# Patient Record
Sex: Male | Born: 1960 | Race: Black or African American | Hispanic: No | Marital: Single | Smoking: Light tobacco smoker
Health system: Southern US, Community
[De-identification: ages and names within clinical notes are randomized; demographics above are authoritative.]

## PROBLEM LIST (undated history)

## (undated) DIAGNOSIS — A150 Tuberculosis of lung: Secondary | ICD-10-CM

## (undated) DIAGNOSIS — E7800 Pure hypercholesterolemia, unspecified: Secondary | ICD-10-CM

## (undated) DIAGNOSIS — Z8249 Family history of ischemic heart disease and other diseases of the circulatory system: Secondary | ICD-10-CM

## (undated) DIAGNOSIS — E119 Type 2 diabetes mellitus without complications: Secondary | ICD-10-CM

## (undated) DIAGNOSIS — Z8739 Personal history of other diseases of the musculoskeletal system and connective tissue: Secondary | ICD-10-CM

## (undated) DIAGNOSIS — K219 Gastro-esophageal reflux disease without esophagitis: Secondary | ICD-10-CM

## (undated) DIAGNOSIS — I10 Essential (primary) hypertension: Secondary | ICD-10-CM

---

## 1979-07-22 HISTORY — PX: HEMORRHOID SURGERY: SHX153

## 1988-07-21 DIAGNOSIS — A150 Tuberculosis of lung: Secondary | ICD-10-CM

## 1988-07-21 HISTORY — DX: Tuberculosis of lung: A15.0

## 2009-02-21 ENCOUNTER — Emergency Department (HOSPITAL_COMMUNITY): Admission: EM | Admit: 2009-02-21 | Discharge: 2009-02-21 | Payer: Self-pay | Admitting: Emergency Medicine

## 2013-07-21 DIAGNOSIS — I10 Essential (primary) hypertension: Secondary | ICD-10-CM

## 2013-07-21 HISTORY — DX: Essential (primary) hypertension: I10

## 2014-06-09 ENCOUNTER — Emergency Department (HOSPITAL_COMMUNITY)
Admission: EM | Admit: 2014-06-09 | Discharge: 2014-06-09 | Disposition: A | Discharge status: Home / Self Care | Payer: Self-pay | Attending: Emergency Medicine | Admitting: Emergency Medicine

## 2014-06-09 ENCOUNTER — Encounter (HOSPITAL_COMMUNITY): Payer: Self-pay | Admitting: *Deleted

## 2014-06-09 DIAGNOSIS — R42 Dizziness and giddiness: Secondary | ICD-10-CM | POA: Insufficient documentation

## 2014-06-09 DIAGNOSIS — I10 Essential (primary) hypertension: Secondary | ICD-10-CM | POA: Insufficient documentation

## 2014-06-09 DIAGNOSIS — Z8611 Personal history of tuberculosis: Secondary | ICD-10-CM | POA: Insufficient documentation

## 2014-06-09 DIAGNOSIS — Z720 Tobacco use: Secondary | ICD-10-CM | POA: Insufficient documentation

## 2014-06-09 DIAGNOSIS — Z8669 Personal history of other diseases of the nervous system and sense organs: Secondary | ICD-10-CM | POA: Insufficient documentation

## 2014-06-09 DIAGNOSIS — R739 Hyperglycemia, unspecified: Secondary | ICD-10-CM

## 2014-06-09 DIAGNOSIS — Z86718 Personal history of other venous thrombosis and embolism: Secondary | ICD-10-CM | POA: Insufficient documentation

## 2014-06-09 DIAGNOSIS — I951 Orthostatic hypotension: Secondary | ICD-10-CM | POA: Insufficient documentation

## 2014-06-09 DIAGNOSIS — E1165 Type 2 diabetes mellitus with hyperglycemia: Secondary | ICD-10-CM | POA: Insufficient documentation

## 2014-06-09 DIAGNOSIS — Z8673 Personal history of transient ischemic attack (TIA), and cerebral infarction without residual deficits: Secondary | ICD-10-CM | POA: Insufficient documentation

## 2014-06-09 DIAGNOSIS — E860 Dehydration: Secondary | ICD-10-CM | POA: Insufficient documentation

## 2014-06-09 DIAGNOSIS — Z9114 Patient's other noncompliance with medication regimen: Secondary | ICD-10-CM | POA: Insufficient documentation

## 2014-06-09 DIAGNOSIS — I252 Old myocardial infarction: Secondary | ICD-10-CM | POA: Insufficient documentation

## 2014-06-09 HISTORY — DX: Tuberculosis of lung: A15.0

## 2014-06-09 HISTORY — DX: Essential (primary) hypertension: I10

## 2014-06-09 LAB — COMPREHENSIVE METABOLIC PANEL WITH GFR
ALT: 34 U/L (ref 0–53)
AST: 20 U/L (ref 0–37)
Albumin: 4 g/dL (ref 3.5–5.2)
Alkaline Phosphatase: 88 U/L (ref 39–117)
Anion gap: 14 (ref 5–15)
BUN: 14 mg/dL (ref 6–23)
CO2: 25 meq/L (ref 19–32)
Calcium: 9.6 mg/dL (ref 8.4–10.5)
Chloride: 99 meq/L (ref 96–112)
Creatinine, Ser: 1 mg/dL (ref 0.50–1.35)
GFR calc Af Amer: >90 mL/min
GFR calc non Af Amer: 83 mL/min — ABNORMAL LOW
Glucose, Bld: 192 mg/dL — ABNORMAL HIGH (ref 70–99)
Potassium: 4.1 meq/L (ref 3.7–5.3)
Sodium: 138 meq/L (ref 137–147)
Total Bilirubin: 0.2 mg/dL — ABNORMAL LOW (ref 0.3–1.2)
Total Protein: 8 g/dL (ref 6.0–8.3)

## 2014-06-09 LAB — CBC
HEMATOCRIT: 44 % (ref 39.0–52.0)
Hemoglobin: 14.6 g/dL (ref 13.0–17.0)
MCH: 30.5 pg (ref 26.0–34.0)
MCHC: 33.2 g/dL (ref 30.0–36.0)
MCV: 92.1 fL (ref 78.0–100.0)
Platelets: 241 10*3/uL (ref 150–400)
RBC: 4.78 MIL/uL (ref 4.22–5.81)
RDW: 13.6 % (ref 11.5–15.5)
WBC: 8.5 10*3/uL (ref 4.0–10.5)

## 2014-06-09 LAB — I-STAT TROPONIN, ED: TROPONIN I, POC: 0 ng/mL (ref 0.00–0.08)

## 2014-06-09 LAB — CBG MONITORING, ED: GLUCOSE-CAPILLARY: 219 mg/dL — AB (ref 70–99)

## 2014-06-09 MED ORDER — LISINOPRIL 10 MG PO TABS: 10.0000 mg | ORAL_TABLET | Freq: Every day | ORAL | Status: DC

## 2014-06-09 MED ORDER — METFORMIN HCL 850 MG PO TABS: 850.0000 mg | ORAL_TABLET | Freq: Two times a day (BID) | ORAL | Status: DC

## 2014-06-09 NOTE — ED Notes (Signed)
Has not taken medications (Metformin and Lisinopril). 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 Feeling lightheaded today. Denies chest pain.

## 2014-06-09 NOTE — ED Provider Notes (Signed)
CSN: [id number]     Arrival date & time 06/09/14  1310 History   First MD Initiated Contact with Patient 06/09/14 1553     Chief Complaint  Patient presents with  . Near Syncope     (Consider location/radiation/quality/duration/timing/severity/associated sxs/prior Treatment) The history is provided by the patient and medical records. No language interpreter was used.     Clemens CatholicWillie Deleon is a 53 y.o. 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 Patient reports he put eggs on the stove to boil, laid down on the couch and awoke with a start. He reports he was concerned about how long the stove had been on he rapidly jumped up running into the kitchen with an immediate feeling of lightheadedness which resolved without intervention and without syncope. Patient denies dizziness, vision changes, chest pain, shortness of breath, pain in his legs, swelling of his legs, history of DVT, history of MI, history of CVA.  Pt reports he was traveling last week, with his return flight 5 days ago, but his bag with his medications was lost on the return flight.  He reports not having his medications for the last 5 days.  He states he takes metformin and lisinopril. Patient reports discussing his symptoms with his primary care office who recommended he present to the emergency department. Associated symptoms include elevated glucoses at home. Patient reports his glucose is 175 this morning and 219 before presenting to the ER.  He reports no return of his lightheadedness or near syncope.  Pt denies fever, chills, headache, neck pain, chest pain, shortest breath, abdominal pain, nausea, vomiting, diarrhea, weakness, dysuria.  Patient reports some polyuria without polydipsia.     Past Medical History  Diagnosis Date  . Diabetes mellitus without  complication   . Hypertension   . TB (pulmonary tuberculosis)    History reviewed. No pertinent past surgical history. History reviewed. No pertinent family history. History  Substance Use Topics  . Smoking status: Current Every Day Smoker    Types: Cigarettes  . Smokeless tobacco: Not on file  . Alcohol Use: Yes     Comment: occ    Review of Systems  Constitutional: Negative for fever, diaphoresis, appetite change, fatigue and unexpected weight change.  HENT: Negative for mouth sores.   Eyes: Negative for visual disturbance.  Respiratory: Negative for cough, chest tightness, shortness of breath and wheezing.   Cardiovascular: Negative for chest pain.  Gastrointestinal: Negative for nausea, vomiting, abdominal pain, diarrhea and constipation.  Endocrine: Negative for polydipsia, polyphagia and polyuria.  Genitourinary: Negative for dysuria, urgency, frequency and hematuria.  Musculoskeletal: Negative for back pain and neck stiffness.  Skin: Negative for rash.  Allergic/Immunologic: Negative for immunocompromised state.  Neurological: Positive for light-headedness. Negative for syncope and headaches.  Hematological: Does not bruise/bleed easily.  Psychiatric/Behavioral: Negative for sleep disturbance. The patient is not nervous/anxious.       Allergies  Review of patient's allergies indicates no known allergies.  Home Medications   Prior to Admission medications   Medication Sig Start Date End Date Taking? Authorizing Provider  lisinopril (PRINIVIL,ZESTRIL) 10 MG tablet Take 1 tablet (10 mg total) by mouth daily. 06/09/14   Rheda Kassab, PA-C  metFORMIN (GLUCOPHAGE) 850 MG tablet Take 1 tablet (850 mg total) by mouth 2 (two) times daily with a meal. 06/09/14   Dahlia ClientHannah  Laynie Espy, PA-C   BP 165/94 mmHg  Pulse 70  Temp(Src) 98.6 F (37 C) (Oral)  Resp 15  Ht 6' 2.5" (1.892 m)  Wt 212 lb (96.163 kg)  BMI 26.86 kg/m2  SpO2 96% Physical Exam  Constitutional: He  is oriented to person, place, and time. He appears well-developed and well-nourished. No distress.  HENT:  Head: Normocephalic and atraumatic.  Mouth/Throat: Oropharynx is clear and moist.  Eyes: Conjunctivae and EOM are normal. Pupils are equal, round, and reactive to light. No scleral icterus.  No horizontal, vertical or rotational nystagmus  Neck: Normal range of motion. Neck supple.  Full active and passive ROM without pain No midline or paraspinal tenderness No nuchal rigidity or meningeal signs  Cardiovascular: Normal rate, regular rhythm, S1 normal, S2 normal, normal heart sounds and intact distal pulses.   No murmur heard. Pulses:      Radial pulses are 2+ on the right side, and 2+ on the left side.       Dorsalis pedis pulses are 2+ on the right side, and 2+ on the left side.       Posterior tibial pulses are 2+ on the right side, and 2+ on the left side.  No tachycardia  Pulmonary/Chest: Effort normal and breath sounds normal. No respiratory distress. He has no wheezes. He has no rales.  Abdominal: Soft. Bowel sounds are normal. He exhibits no distension. There is no tenderness. There is no rebound and no guarding.  Musculoskeletal: Normal range of motion.  No peripheral edema No pain to palpation of the calves Negative Homans sign No palpable cord  Lymphadenopathy:    He has no cervical adenopathy.  Neurological: He is alert and oriented to person, place, and time. He has normal reflexes. No cranial nerve deficit. He exhibits normal muscle tone. Coordination normal.  Mental Status:  Alert, oriented, thought content appropriate. Speech fluent without evidence of aphasia. Able to follow 2 step commands without difficulty.  Cranial Nerves:  II:  Peripheral visual fields grossly normal, pupils equal, round, reactive to light III,IV, VI: ptosis not present, extra-ocular motions intact bilaterally  V,VII: smile symmetric, left facial droop with involvement of the forehead (pt  reports this is baseline and present since he was 23); facial light touch sensation equal VIII: hearing grossly normal bilaterally  IX,X: gag reflex present  XI: bilateral shoulder shrug equal and strong XII: midline tongue extension  Motor:  5/5 in upper and lower extremities bilaterally including strong and equal grip strength and dorsiflexion/plantar flexion Sensory: Pinprick and light touch normal in all extremities.  Deep Tendon Reflexes: 2+ and symmetric  Cerebellar: normal finger-to-nose with bilateral upper extremities Gait: normal gait and balance CV: distal pulses palpable throughout  NO clonus Normal Rhomberg  Skin: Skin is warm and dry. No rash noted. He is not diaphoretic.  Psychiatric: He has a normal mood and affect. His behavior is normal. Judgment and thought content normal.  Nursing note and vitals reviewed.   ED Course  Procedures (including critical care time) Labs Review Labs Reviewed  COMPREHENSIVE METABOLIC PANEL - Abnormal; Notable for the following:    Glucose, Bld 192 (*)    Total Bilirubin 0.2 (*)    GFR calc non Af Amer 83 (*)    All other components within normal limits  CBG MONITORING, ED - Abnormal; Notable for the following:    Glucose-Capillary 219 (*)    All other components within normal limits  CBC  I-STAT TROPOININ, ED  Imaging Review No results found.   EKG Interpretation   Date/Time:  Friday June 09 2014 13:20:55 EST Ventricular Rate:  86 PR Interval:  142 QRS Duration: 92 QT Interval:  374 QTC Calculation: 447 R Axis:   4 Text Interpretation:  Normal sinus rhythm Incomplete right bundle branch  block Borderline ECG Confirmed by Fayrene FearingJAMES  MD, MARK ([id number]) on 06/09/2014  3:54:42 PM      MDM   Final diagnoses:  Lightheadedness  Hyperglycemia without ketosis  Essential hypertension  Noncompliance with medication regimen    Clemens CatholicWillie Calkin presents with c/o lightheadedness occuring this morning with a description more  consistent with orthostatic hypotension.  Pt without return of symptoms since the initial episode.  Patient is without chest pain, shortness of breath, hemoptysis, tachycardia, hypoxia or clinical symptoms of DVT to suggest that today's episode was caused by a pulmonary embolism.  No evidence of hypotension here in the emergency department. On neurologic exam, doubt CVA.  Patient's symptoms likely secondary to elevated glucose and subsequent dehydration in conjunction with rapid position change.  Labs reassuring and EKG without evidence of acute ischemia. Doubt acute coronary syndrome..  Patient ambulates here in the emergency department without difficulty. He has been given a refill of his lisinopril and metformin. He is to return immediately to the emergency department if he develops chest pain, shortness of breath or any returning or new symptoms.  I have personally reviewed patient's vitals, nursing note and any pertinent labs or imaging.  I performed an undressed physical exam.    It has been determined that no acute conditions requiring further emergency intervention are present at this time. The patient/guardian have been advised of the diagnosis and plan. I reviewed all labs and imaging including any potential incidental findings. We have discussed signs and symptoms that warrant return to the ED and they are listed in the discharge instructions.    Vital signs are stable at discharge.   BP 165/94 mmHg  Pulse 70  Temp(Src) 98.6 F (37 C) (Oral)  Resp 15  Ht 6' 2.5" (1.892 m)  Wt 212 lb (96.163 kg)  BMI 26.86 kg/m2  SpO2 96%        Dierdre ForthHannah Akisha Sturgill, PA-C 06/09/14 1746  Benny LennertJoseph L Zammit, MD 06/09/14 1909

## 2014-06-09 NOTE — ED Notes (Signed)
Pt reports recent travel and lost his bag that had bp and diabetes prescriptions in it, has been without x 5 days. Today feeling lightheaded and near syncope. No acute distress noted at triage, bp 181/101.

## 2014-06-09 NOTE — Discharge Instructions (Signed)
1. Medications: take your usual home medications, refilled for you today 2. Treatment: rest, drink plenty of fluids,  3. Follow Up: Please followup with your primary doctor in 3 days for discussion of your diagnoses and further evaluation after today's visit; if you do not have a primary care doctor use the resource guide provided to find one; Please return to the ER for return of symptoms, development of vision changes, slurred speech, CP, SOB or other concerning symptoms    Near-Syncope Near-syncope (commonly known as near fainting) is sudden weakness, dizziness, or feeling like you might pass out. During an episode of near-syncope, you may also develop pale skin, have tunnel vision, or feel sick to your stomach (nauseous). Near-syncope may occur when getting up after sitting or while standing for a long time. It is caused by a sudden decrease in blood flow to the brain. This decrease can result from various causes or triggers, most of which are not serious. However, because near-syncope can sometimes be a sign of something serious, a medical evaluation is required. The specific cause is often not determined. HOME CARE INSTRUCTIONS  Monitor your condition for any changes. The following actions may help to alleviate any discomfort you are experiencing:  Have someone stay with you until you feel stable.  Lie down right away and prop your feet up if you start feeling like you might faint. Breathe deeply and steadily. Wait until all the symptoms have passed. Most of these episodes last only a few minutes. You may feel tired for several hours.   Drink enough fluids to keep your urine clear or pale yellow.   If you are taking blood pressure or heart medicine, get up slowly when seated or lying down. Take several minutes to sit and then stand. This can reduce dizziness.  Follow up with your health care provider as directed. SEEK IMMEDIATE MEDICAL CARE IF:   You have a severe headache.   You  have unusual pain in the chest, abdomen, or back.   You are bleeding from the mouth or rectum, or you have black or tarry stool.   You have an irregular or very fast heartbeat.   You have repeated fainting or have seizure-like jerking during an episode.   You faint when sitting or lying down.   You have confusion.   You have difficulty walking.   You have severe weakness.   You have vision problems.  MAKE SURE YOU:   Understand these instructions.  Will watch your condition.  Will get help right away if you are not doing well or get worse. Document Released: 07/07/2005 Document Revised: 07/12/2013 Document Reviewed: 12/10/2012 [facility] Patient Information 2015 [employer] [place]. This information is not intended to replace advice given to you by your health care provider. Make sure you discuss any questions you have with your health care provider.

## 2014-07-27 ENCOUNTER — Telehealth: Payer: Self-pay | Admitting: Surgery

## 2014-07-27 NOTE — Telephone Encounter (Signed)
  CARE MANAGEMENT ED NOTE 07/27/2014  Patient:  Clemens CatholicHOWARD,Dawood   Account Number:  [id number]  Date Initiated:  07/27/2014  Documentation initiated by:  [facility]OGERS,Matt Delpizzo  Subjective/Objective Assessment:     Subjective/Objective Assessment Detail:   Patient called concerning establishing Primary Care and Medication refill     Action/Plan:   [facility]   Action/Plan Detail:   Anticipated DC Date:  07/27/2014     Status Recommendation to Physician:   Result of Recommendation:  Agreed    DC Planning Services  CM consult  Follow-up appt scheduled  Medication Assistance    Choice offered to / List presented to:            Status of service:  Completed, signed off  ED Comments:   ED Comments Detail:  ED CM received call from patient regarding the need to establish primary care and medication refills. Patient was seen in the ED and states that he scheduled an appt but the [facility] until the end of January. Patient has ran out of b/p and oral diabetic medication. [facility] agreeable, appt was schedule for 08/07/13 at 1000 am. EDP E. West PA-C called in refills x1 Lisinopril 10 mg daily and [facility] [address] [phone]. No further ED CM needs identified.

## 2014-08-07 ENCOUNTER — Ambulatory Visit: Payer: Self-pay | Admitting: Family Medicine

## 2014-09-08 ENCOUNTER — Encounter: Payer: Self-pay | Admitting: Family Medicine

## 2014-09-08 ENCOUNTER — Ambulatory Visit: Payer: Self-pay | Attending: Family Medicine | Admitting: Family Medicine

## 2014-09-08 VITALS — BP 139/81 | HR 75 | Temp 98.2°F | Resp 16 | Ht 75.0 in | Wt 207.0 lb

## 2014-09-08 DIAGNOSIS — I10 Essential (primary) hypertension: Secondary | ICD-10-CM | POA: Insufficient documentation

## 2014-09-08 DIAGNOSIS — Z720 Tobacco use: Secondary | ICD-10-CM

## 2014-09-08 DIAGNOSIS — E119 Type 2 diabetes mellitus without complications: Secondary | ICD-10-CM | POA: Insufficient documentation

## 2014-09-08 DIAGNOSIS — K047 Periapical abscess without sinus: Secondary | ICD-10-CM | POA: Insufficient documentation

## 2014-09-08 DIAGNOSIS — K088 Other specified disorders of teeth and supporting structures: Secondary | ICD-10-CM | POA: Insufficient documentation

## 2014-09-08 DIAGNOSIS — B351 Tinea unguium: Secondary | ICD-10-CM | POA: Insufficient documentation

## 2014-09-08 DIAGNOSIS — F17200 Nicotine dependence, unspecified, uncomplicated: Secondary | ICD-10-CM | POA: Insufficient documentation

## 2014-09-08 LAB — GLUCOSE, POCT (MANUAL RESULT ENTRY): POC GLUCOSE: 135 mg/dL — AB (ref 70–99)

## 2014-09-08 LAB — POCT GLYCOSYLATED HEMOGLOBIN (HGB A1C): HEMOGLOBIN A1C: 6.6

## 2014-09-08 MED ORDER — ACETAMINOPHEN-CODEINE #3 300-30 MG PO TABS: 1.0000 | ORAL_TABLET | Freq: Three times a day (TID) | ORAL | Status: DC | PRN

## 2014-09-08 MED ORDER — PENICILLIN V POTASSIUM 250 MG PO TABS: 250.0000 mg | ORAL_TABLET | Freq: Three times a day (TID) | ORAL | Status: DC

## 2014-09-08 MED ORDER — GLUCOSE BLOOD VI STRP: 1.0000 | ORAL_STRIP | Freq: Two times a day (BID) | Status: DC

## 2014-09-08 MED ORDER — TRUERESULT BLOOD GLUCOSE W/DEVICE KIT: 1.0000 | PACK | Freq: Every day | Status: DC

## 2014-09-08 MED ORDER — TRUEPLUS LANCETS 28G MISC: 1.0000 | Freq: Three times a day (TID) | Status: DC

## 2014-09-08 MED ORDER — LISINOPRIL 10 MG PO TABS: 10.0000 mg | ORAL_TABLET | Freq: Every day | ORAL | Status: DC

## 2014-09-08 MED ORDER — METFORMIN HCL 850 MG PO TABS: 850.0000 mg | ORAL_TABLET | Freq: Two times a day (BID) | ORAL | Status: DC

## 2014-09-08 NOTE — Assessment & Plan Note (Signed)
HTN:  Goal BP < 140/90 Refilled lisinopril

## 2014-09-08 NOTE — Patient Instructions (Signed)
Mr. Dimas AguasHoward,  Thank you for coming in today. It was a pleasure meeting you. I look forward to being your primary doctor.   1. Diabetes: Well controlled Goal A1c < 7 You are at goal  Diabetes  Check blood sugar day fasting (3 times per day) and before meals  Goal fasting 100  Goal after eating < 160 Beware of hypoglycemia (low blood sugar) which is blood sugar < 70 with or without symptoms   2. HTN:  Goal BP < 140/90 Refilled lisinopril   3. Dental abscess: Penicillin Tylenol #3 for pain   F/u for labs- fasting cholesterol  You will be called with results  F/u with me in 3 months  Dr. Armen PickupFunches

## 2014-09-08 NOTE — Assessment & Plan Note (Signed)
A: multiple toenails involved. Patient prefers topical P: OTC topicals for now lamisil is an option if patient desires

## 2014-09-08 NOTE — Assessment & Plan Note (Signed)
1. Diabetes: Well controlled Goal A1c < 7 You are at goal  Diabetes  Check blood sugar day fasting (3 times per day) and before meals  Goal fasting 100  Goal after eating < 160 Beware of hypoglycemia (low blood sugar) which is blood sugar < 70 with or without symptoms

## 2014-09-08 NOTE — Progress Notes (Signed)
   Subjective:    Patient ID: Jose Deleon, male    DOB: 1961/07/15, 54 y.o.   MRN: [id number] CC; establish care, DM2, HTN, dental abscess  HPI 54 yo M new patient:  1. CHRONIC DIABETES dx in 2015 Disease Monitoring  Blood Sugar Ranges: 100-130  Polyuria: no   Visual problems: no   Medication Compliance: yes  Medication Side Effects  Hypoglycemia: no   Preventitive Health Care  Eye Exam: due   Foot Exam: done today  Diet pattern: 3 meals per day   Exercise: minmum     2. HTN: no CP, SOB. Out of lisinopril for 3 days. Taking lisinopril for one year. No cough or swelling.   3. Dental pain: very poor dentition. Swelling in R upper face. No fever. No drainage. Many broken teeth.   Soc Hx: current smoker, cutting back 1 PPD x 30 years  Review of Systems As per HPI     Objective:   Physical Exam BP 139/81 mmHg  Pulse 75  Temp(Src) 98.2 F (36.8 C)  Resp 16  Ht 6\' 3"  (1.905 m)  Wt 207 lb (93.895 kg)  BMI 25.87 kg/m2  SpO2 95% General appearance: alert, cooperative and no distress Throat: abnormal findings: dentition: multiple carries, swollen R upper jaw Lungs: clear to auscultation bilaterally Extremities: extremities normal, atraumatic, no cyanosis or edema  Thickened and darkened toenails.  Diabetic foot exam done   CBG 132 Lab Results  Component Value Date   HGBA1C 6.6 09/08/2014       Assessment & Plan:

## 2014-09-08 NOTE — Assessment & Plan Note (Signed)
3. Dental abscess: Penicillin Tylenol #3 for pain

## 2014-09-08 NOTE — Progress Notes (Signed)
Patient here to establish care Patient feels he is getting a dental abscess on right upper tooth Patient needs refills on medications and would like information on proper nutrition for diabetes

## 2014-09-09 LAB — MICROALBUMIN / CREATININE URINE RATIO
Creatinine, Urine: 180.7 mg/dL
MICROALB/CREAT RATIO: 2.2 mg/g (ref 0.0–30.0)
Microalb, Ur: 0.4 mg/dL (ref <2.0)

## 2014-09-14 ENCOUNTER — Telehealth: Payer: Self-pay | Admitting: *Deleted

## 2014-09-14 NOTE — Telephone Encounter (Signed)
-----   Message from Lora PaulaJosalyn C Funches, MD sent at 09/10/2014  8:12 AM EST ----- Normal urine microalbumin.  Continue current treatment regimen

## 2014-09-14 NOTE — Telephone Encounter (Signed)
Left voice message with normal labs Continue with treatment as directed

## 2014-12-08 ENCOUNTER — Other Ambulatory Visit: Payer: Self-pay | Admitting: Family Medicine

## 2014-12-08 ENCOUNTER — Other Ambulatory Visit: Payer: Self-pay | Admitting: *Deleted

## 2014-12-08 DIAGNOSIS — E119 Type 2 diabetes mellitus without complications: Secondary | ICD-10-CM

## 2014-12-08 MED ORDER — LISINOPRIL 10 MG PO TABS: 10.0000 mg | ORAL_TABLET | Freq: Every day | ORAL | Status: DC

## 2014-12-08 NOTE — Telephone Encounter (Signed)
Patient called to request a med refill for Lisinopril, patient ran out of his medication yesterday, patient uses [facility] [address]. Please f/u

## 2014-12-11 ENCOUNTER — Ambulatory Visit: Payer: Self-pay | Admitting: Family Medicine

## 2015-10-05 ENCOUNTER — Telehealth: Payer: Self-pay | Admitting: Family Medicine

## 2015-10-05 NOTE — Telephone Encounter (Signed)
Pt. Called requesting to speak to nurse because he needs a refill on metFORMIN (GLUCOPHAGE) 850 MG tablet. Please f/u with pt.

## 2015-10-08 ENCOUNTER — Encounter: Payer: Self-pay | Admitting: Family Medicine

## 2015-10-08 ENCOUNTER — Other Ambulatory Visit: Payer: Self-pay | Admitting: Family Medicine

## 2015-10-08 ENCOUNTER — Ambulatory Visit: Payer: Self-pay | Attending: Family Medicine | Admitting: Family Medicine

## 2015-10-08 VITALS — BP 157/91 | HR 73 | Temp 98.0°F | Resp 15 | Ht 75.0 in | Wt 210.0 lb

## 2015-10-08 DIAGNOSIS — E785 Hyperlipidemia, unspecified: Secondary | ICD-10-CM

## 2015-10-08 DIAGNOSIS — G4700 Insomnia, unspecified: Secondary | ICD-10-CM

## 2015-10-08 DIAGNOSIS — N529 Male erectile dysfunction, unspecified: Secondary | ICD-10-CM | POA: Insufficient documentation

## 2015-10-08 DIAGNOSIS — N522 Drug-induced erectile dysfunction: Secondary | ICD-10-CM

## 2015-10-08 DIAGNOSIS — F17210 Nicotine dependence, cigarettes, uncomplicated: Secondary | ICD-10-CM | POA: Insufficient documentation

## 2015-10-08 DIAGNOSIS — I10 Essential (primary) hypertension: Secondary | ICD-10-CM

## 2015-10-08 DIAGNOSIS — Z1159 Encounter for screening for other viral diseases: Secondary | ICD-10-CM

## 2015-10-08 DIAGNOSIS — B353 Tinea pedis: Secondary | ICD-10-CM

## 2015-10-08 DIAGNOSIS — Z114 Encounter for screening for human immunodeficiency virus [HIV]: Secondary | ICD-10-CM

## 2015-10-08 DIAGNOSIS — E1169 Type 2 diabetes mellitus with other specified complication: Secondary | ICD-10-CM

## 2015-10-08 DIAGNOSIS — Z79899 Other long term (current) drug therapy: Secondary | ICD-10-CM | POA: Insufficient documentation

## 2015-10-08 DIAGNOSIS — E119 Type 2 diabetes mellitus without complications: Secondary | ICD-10-CM

## 2015-10-08 LAB — POCT GLYCOSYLATED HEMOGLOBIN (HGB A1C): HEMOGLOBIN A1C: 6.4

## 2015-10-08 LAB — GLUCOSE, POCT (MANUAL RESULT ENTRY): POC Glucose: 123 mg/dl — AB (ref 70–99)

## 2015-10-08 MED ORDER — METFORMIN HCL 850 MG PO TABS: 850.0000 mg | ORAL_TABLET | Freq: Every day | ORAL | Status: DC

## 2015-10-08 MED ORDER — LISINOPRIL 40 MG PO TABS: 40.0000 mg | ORAL_TABLET | Freq: Every day | ORAL | Status: DC

## 2015-10-08 MED ORDER — DIPHENHYDRAMINE HCL 25 MG PO TABS: 25.0000 mg | ORAL_TABLET | Freq: Four times a day (QID) | ORAL | Status: DC | PRN

## 2015-10-08 MED ORDER — TERBINAFINE HCL 1 % EX CREA: 1.0000 "application " | TOPICAL_CREAM | Freq: Two times a day (BID) | CUTANEOUS | Status: DC

## 2015-10-08 MED ORDER — METFORMIN HCL 850 MG PO TABS: 850.0000 mg | ORAL_TABLET | Freq: Two times a day (BID) | ORAL | Status: DC

## 2015-10-08 MED ORDER — SILDENAFIL CITRATE 100 MG PO TABS: 50.0000 mg | ORAL_TABLET | Freq: Every day | ORAL | Status: DC | PRN

## 2015-10-08 NOTE — Progress Notes (Signed)
Patient here to follow up on DM2 and HTN He is in need of refills on his metformin and lisinopril He reports he is trying to quit smoking and had his last cigarette on Friday No drugs and social ETOH No depression and anxiety

## 2015-10-08 NOTE — Assessment & Plan Note (Signed)
Well controlled Metformin once daily

## 2015-10-08 NOTE — Telephone Encounter (Signed)
Pt need OV. Last visit Feb 2016

## 2015-10-08 NOTE — Assessment & Plan Note (Signed)
A: elevated BP P: Increase lisinopril to 40 mg daily

## 2015-10-08 NOTE — Progress Notes (Signed)
Subjective:  Patient ID: Jose Deleon, male    DOB: [birth year]  Age: 55 y.o. MRN: [id number]  CC: Diabetes and Hypertension   HPI Leovardo Thoman presents for    1. CHRONIC HYPERTENSION  Disease Monitoring  Blood pressure range: not checking   Chest pain: no   Dyspnea: no   Claudication: no   Medication compliance: yes  Medication Side Effects  Lightheadedness: no   Urinary frequency: no   Edema: no   Impotence: no   Preventitive Healthcare:  Exercise: no    2. CHRONIC DIABETES  Disease Monitoring  Blood Sugar Ranges: not checking   Polyuria: no   Visual problems: no   Medication Compliance: yes  Medication Side Effects  Hypoglycemia: no   Preventitive Health Care  Eye Exam: due. Prefers to wait for health insurance.   Foot Exam: done today     3. Erectile dysfunction: for past 2 years. Having trouble getting and maintaining erections. Same partner for many years. Has not tried viagra.   4. Insomnia: has not worked for 1.5 years. Sleep is irregular and often interrupted. Sleeps for two hours. No depression or anxiety.    5. Due for screening colonoscopy: he is starting a new job. Prefers to wait until he gets his health insurance.   Social History  Substance Use Topics  . Smoking status: Current Every Day Smoker -- 0.25 packs/day    Types: Cigarettes  . Smokeless tobacco: Not on file  . Alcohol Use: Yes     Comment: occ   Outpatient Prescriptions Prior to Visit  Medication Sig Dispense Refill  . acetaminophen-codeine (TYLENOL #3) 300-30 MG per tablet Take 1 tablet by mouth every 8 (eight) hours as needed for moderate pain. 30 tablet 0  . Blood Glucose Monitoring Suppl (TRUERESULT BLOOD GLUCOSE) W/DEVICE KIT 1 each by Does not apply route daily. 1 each 0  . glucose blood (TRUETEST TEST) test strip 1 each by Other route 2 (two) times daily. Use as instructed 100 each 12  . lisinopril (PRINIVIL,ZESTRIL) 10 MG tablet Take 1 tablet (10 mg total) by mouth  daily. 30 tablet 11  . metFORMIN (GLUCOPHAGE) 850 MG tablet Take 1 tablet (850 mg total) by mouth 2 (two) times daily with a meal. 180 tablet 3  . penicillin v potassium (VEETID) 250 MG tablet Take 1 tablet (250 mg total) by mouth 3 (three) times daily. 30 tablet 0  . TRUEPLUS LANCETS 28G MISC 1 each by Does not apply route 3 (three) times daily. 100 each 12   No facility-administered medications prior to visit.    ROS Review of Systems  Constitutional: Negative for fever, chills, fatigue and unexpected weight change.  Eyes: Positive for visual disturbance.  Respiratory: Negative for cough and shortness of breath.   Cardiovascular: Negative for chest pain, palpitations and leg swelling.  Gastrointestinal: Negative for nausea, vomiting, abdominal pain, diarrhea, constipation and blood in stool.  Endocrine: Negative for polydipsia, polyphagia and polyuria.  Musculoskeletal: Negative for myalgias, back pain, arthralgias, gait problem and neck pain.  Skin: Negative for rash.  Allergic/Immunologic: Negative for immunocompromised state.  Hematological: Negative for adenopathy. Does not bruise/bleed easily.  Psychiatric/Behavioral: Positive for sleep disturbance. Negative for suicidal ideas and dysphoric mood. The patient is not nervous/anxious.     Objective:  BP 157/91 mmHg  Pulse 73  Temp(Src) 98 F (36.7 C)  Resp 15  Ht _0  (1.905 m)  Wt 210 lb (95.255 kg)  BMI 26.25 kg/m2  SpO2 97%  BP/Weight 10/08/2015 09/08/2014 10/09/2246  Systolic BP 250 037 048  Diastolic BP 91 81 94  Wt. (Lbs) 210 207 212  BMI 26.25 25.87 26.86   Physical Exam  Constitutional: He appears well-developed and well-nourished. No distress.  HENT:  Head: Normocephalic and atraumatic.  Mouth/Throat: Abnormal dentition. Dental caries present.  Neck: Normal range of motion. Neck supple.  Cardiovascular: Normal rate, regular rhythm, normal heart sounds and intact distal pulses.   Pulmonary/Chest: Effort  normal and breath sounds normal.  Musculoskeletal: He exhibits no edema.       Feet:  Neurological: He is alert.  Skin: Skin is warm and dry. No rash noted. No erythema.  Psychiatric: He has a normal mood and affect.    Lab Results  Component Value Date   HGBA1C 6.4 10/08/2015   CBG 123 Assessment & Plan:   Kendre was seen today for diabetes and hypertension.  Diagnoses and all orders for this visit:  Type 2 diabetes mellitus without complication, without long-term current use of insulin (HCC) -     Glucose (CBG) -     HgB A1c -     COMPLETE METABOLIC PANEL WITH GFR -     Lipid Panel  Essential hypertension -     lisinopril (PRINIVIL,ZESTRIL) 40 MG tablet; Take 1 tablet (40 mg total) by mouth daily. -     COMPLETE METABOLIC PANEL WITH GFR -     Lipid Panel  Diabetes mellitus without complication (HCC) -     Discontinue: metFORMIN (GLUCOPHAGE) 850 MG tablet; Take 1 tablet (850 mg total) by mouth 2 (two) times daily with a meal. -     lisinopril (PRINIVIL,ZESTRIL) 40 MG tablet; Take 1 tablet (40 mg total) by mouth daily. -     metFORMIN (GLUCOPHAGE) 850 MG tablet; Take 1 tablet (850 mg total) by mouth daily with breakfast.  Insomnia -     diphenhydrAMINE (BENADRYL) 25 MG tablet; Take 1-2 tablets (25-50 mg total) by mouth every 6 (six) hours as needed for sleep.  Drug-induced erectile dysfunction -     sildenafil (VIAGRA) 100 MG tablet; Take 0.5-1 tablets (50-100 mg total) by mouth daily as needed for erectile dysfunction.  Screening for HIV (human immunodeficiency virus) -     HIV antibody (with reflex)  Need for hepatitis C screening test -     Hepatitis C antibody, reflex    No orders of the defined types were placed in this encounter.    Follow-up: No Follow-up on file.   Boykin Nearing MD

## 2015-10-08 NOTE — Patient Instructions (Addendum)
Jose Deleon was seen today for diabetes and hypertension.  Diagnoses and all orders for this visit:  Type 2 diabetes mellitus without complication, without long-term current use of insulin (HCC) -     Glucose (CBG) -     HgB A1c -     COMPLETE METABOLIC PANEL WITH GFR -     Lipid Panel  Essential hypertension -     lisinopril (PRINIVIL,ZESTRIL) 40 MG tablet; Take 1 tablet (40 mg total) by mouth daily. -     COMPLETE METABOLIC PANEL WITH GFR -     Lipid Panel  Diabetes mellitus without complication (HCC) -     Discontinue: metFORMIN (GLUCOPHAGE) 850 MG tablet; Take 1 tablet (850 mg total) by mouth 2 (two) times daily with a meal. -     lisinopril (PRINIVIL,ZESTRIL) 40 MG tablet; Take 1 tablet (40 mg total) by mouth daily. -     metFORMIN (GLUCOPHAGE) 850 MG tablet; Take 1 tablet (850 mg total) by mouth daily with breakfast.  Insomnia -     diphenhydrAMINE (BENADRYL) 25 MG tablet; Take 1-2 tablets (25-50 mg total) by mouth every 6 (six) hours as needed for sleep.  Drug-induced erectile dysfunction -     sildenafil (VIAGRA) 100 MG tablet; Take 0.5-1 tablets (50-100 mg total) by mouth daily as needed for erectile dysfunction.  Screening for HIV (human immunodeficiency virus) -     HIV antibody (with reflex)  Need for hepatitis C screening test -     Hepatitis C antibody, reflex   F/u in  4-6 weeks for HTN  Dr. Armen PickupFunches

## 2015-10-09 LAB — LIPID PANEL
CHOL/HDL RATIO: 3 ratio (ref <=5.0)
CHOLESTEROL: 214 mg/dL — AB (ref 125–200)
HDL: 72 mg/dL (ref >=40)
LDL Cholesterol: 96 mg/dL (ref <130)
Triglycerides: 230 mg/dL — ABNORMAL HIGH (ref <150)
VLDL: 46 mg/dL — ABNORMAL HIGH (ref <30)

## 2015-10-09 LAB — COMPLETE METABOLIC PANEL WITH GFR
ALT: 26 U/L (ref 9–46)
AST: 19 U/L (ref 10–35)
Albumin: 4.2 g/dL (ref 3.6–5.1)
Alkaline Phosphatase: 80 U/L (ref 40–115)
BUN: 13 mg/dL (ref 7–25)
CALCIUM: 9.4 mg/dL (ref 8.6–10.3)
CHLORIDE: 102 mmol/L (ref 98–110)
CO2: 25 mmol/L (ref 20–31)
Creat: 0.98 mg/dL (ref 0.70–1.33)
GFR, EST NON AFRICAN AMERICAN: 86 mL/min (ref >=60)
GFR, Est African American: >89 mL/min (ref >=60)
Glucose, Bld: 106 mg/dL — ABNORMAL HIGH (ref 65–99)
POTASSIUM: 4.2 mmol/L (ref 3.5–5.3)
SODIUM: 138 mmol/L (ref 135–146)
Total Bilirubin: 0.3 mg/dL (ref 0.2–1.2)
Total Protein: 7.5 g/dL (ref 6.1–8.1)

## 2015-10-09 LAB — HIV ANTIBODY (ROUTINE TESTING W REFLEX): HIV 1&2 Ab, 4th Generation: NONREACTIVE

## 2015-10-09 LAB — HEPATITIS C ANTIBODY: HCV Ab: NEGATIVE

## 2015-10-09 MED ORDER — ATORVASTATIN CALCIUM 40 MG PO TABS: 40.0000 mg | ORAL_TABLET | Freq: Every day | ORAL | Status: DC

## 2015-10-09 NOTE — Addendum Note (Signed)
Addended by: Dessa PhiFUNCHES, Birtie Fellman on: 10/09/2015 08:48 AM   Modules accepted: Orders, SmartSet

## 2015-10-10 ENCOUNTER — Telehealth: Payer: Self-pay | Admitting: *Deleted

## 2015-10-10 NOTE — Telephone Encounter (Signed)
LVM to return call   [facility]

## 2015-10-10 NOTE — Telephone Encounter (Signed)
-----   Message from Dessa PhiJosalyn Funches, MD sent at 10/09/2015  8:47 AM EDT ----- screening HIV and Hep C negative Normal CMP Elevated cholesterol in diabetes, start lipitor 40 mg daily

## 2015-12-30 ENCOUNTER — Other Ambulatory Visit: Payer: Self-pay | Admitting: Family Medicine

## 2016-03-11 ENCOUNTER — Encounter (HOSPITAL_COMMUNITY): Payer: Self-pay | Admitting: Emergency Medicine

## 2016-03-11 ENCOUNTER — Emergency Department (HOSPITAL_COMMUNITY): Payer: Self-pay

## 2016-03-11 ENCOUNTER — Observation Stay (HOSPITAL_COMMUNITY): Payer: Self-pay

## 2016-03-11 ENCOUNTER — Observation Stay (HOSPITAL_COMMUNITY)
Admission: EM | Admit: 2016-03-11 | Discharge: 2016-03-13 | Disposition: A | Discharge status: Home / Self Care | Payer: Self-pay | Attending: Cardiovascular Disease | Admitting: Cardiovascular Disease

## 2016-03-11 DIAGNOSIS — F17200 Nicotine dependence, unspecified, uncomplicated: Secondary | ICD-10-CM | POA: Diagnosis present

## 2016-03-11 DIAGNOSIS — E119 Type 2 diabetes mellitus without complications: Secondary | ICD-10-CM | POA: Insufficient documentation

## 2016-03-11 DIAGNOSIS — Z8611 Personal history of tuberculosis: Secondary | ICD-10-CM | POA: Insufficient documentation

## 2016-03-11 DIAGNOSIS — Z8249 Family history of ischemic heart disease and other diseases of the circulatory system: Secondary | ICD-10-CM | POA: Insufficient documentation

## 2016-03-11 DIAGNOSIS — F17210 Nicotine dependence, cigarettes, uncomplicated: Secondary | ICD-10-CM | POA: Insufficient documentation

## 2016-03-11 DIAGNOSIS — R0789 Other chest pain: Principal | ICD-10-CM | POA: Insufficient documentation

## 2016-03-11 DIAGNOSIS — I509 Heart failure, unspecified: Secondary | ICD-10-CM | POA: Insufficient documentation

## 2016-03-11 DIAGNOSIS — R0600 Dyspnea, unspecified: Secondary | ICD-10-CM

## 2016-03-11 DIAGNOSIS — G4700 Insomnia, unspecified: Secondary | ICD-10-CM | POA: Insufficient documentation

## 2016-03-11 DIAGNOSIS — Z7982 Long term (current) use of aspirin: Secondary | ICD-10-CM | POA: Insufficient documentation

## 2016-03-11 DIAGNOSIS — Z720 Tobacco use: Secondary | ICD-10-CM

## 2016-03-11 DIAGNOSIS — Z7984 Long term (current) use of oral hypoglycemic drugs: Secondary | ICD-10-CM | POA: Insufficient documentation

## 2016-03-11 DIAGNOSIS — K219 Gastro-esophageal reflux disease without esophagitis: Secondary | ICD-10-CM | POA: Insufficient documentation

## 2016-03-11 DIAGNOSIS — R079 Chest pain, unspecified: Secondary | ICD-10-CM | POA: Diagnosis present

## 2016-03-11 DIAGNOSIS — I110 Hypertensive heart disease with heart failure: Secondary | ICD-10-CM | POA: Insufficient documentation

## 2016-03-11 DIAGNOSIS — I10 Essential (primary) hypertension: Secondary | ICD-10-CM | POA: Diagnosis present

## 2016-03-11 DIAGNOSIS — E785 Hyperlipidemia, unspecified: Secondary | ICD-10-CM | POA: Insufficient documentation

## 2016-03-11 DIAGNOSIS — Z23 Encounter for immunization: Secondary | ICD-10-CM | POA: Insufficient documentation

## 2016-03-11 HISTORY — DX: Gastro-esophageal reflux disease without esophagitis: K21.9

## 2016-03-11 HISTORY — DX: Personal history of other diseases of the musculoskeletal system and connective tissue: Z87.39

## 2016-03-11 HISTORY — DX: Family history of ischemic heart disease and other diseases of the circulatory system: Z82.49

## 2016-03-11 HISTORY — DX: Type 2 diabetes mellitus without complications: E11.9

## 2016-03-11 HISTORY — DX: Pure hypercholesterolemia, unspecified: E78.00

## 2016-03-11 LAB — I-STAT TROPONIN, ED
TROPONIN I, POC: 0 ng/mL (ref 0.00–0.08)
Troponin i, poc: 0 ng/mL (ref 0.00–0.08)

## 2016-03-11 LAB — CBC
HEMATOCRIT: 45.9 % (ref 39.0–52.0)
HEMOGLOBIN: 14.8 g/dL (ref 13.0–17.0)
MCH: 30.3 pg (ref 26.0–34.0)
MCHC: 32.2 g/dL (ref 30.0–36.0)
MCV: 93.9 fL (ref 78.0–100.0)
Platelets: 249 10*3/uL (ref 150–400)
RBC: 4.89 MIL/uL (ref 4.22–5.81)
RDW: 14 % (ref 11.5–15.5)
WBC: 9 10*3/uL (ref 4.0–10.5)

## 2016-03-11 LAB — LIPID PANEL
CHOL/HDL RATIO: 3.3 ratio
CHOLESTEROL: 203 mg/dL — AB (ref 0–200)
HDL: 62 mg/dL (ref >40)
LDL Cholesterol: 123 mg/dL — ABNORMAL HIGH (ref 0–99)
Triglycerides: 88 mg/dL (ref <150)
VLDL: 18 mg/dL (ref 0–40)

## 2016-03-11 LAB — BASIC METABOLIC PANEL
ANION GAP: 5 (ref 5–15)
BUN: 12 mg/dL (ref 6–20)
CO2: 26 mmol/L (ref 22–32)
Calcium: 9.1 mg/dL (ref 8.9–10.3)
Chloride: 105 mmol/L (ref 101–111)
Creatinine, Ser: 1.05 mg/dL (ref 0.61–1.24)
GFR calc Af Amer: >60 mL/min (ref >60)
GLUCOSE: 102 mg/dL — AB (ref 65–99)
POTASSIUM: 4.2 mmol/L (ref 3.5–5.1)
Sodium: 136 mmol/L (ref 135–145)

## 2016-03-11 LAB — SEDIMENTATION RATE: SED RATE: 10 mm/h (ref 0–16)

## 2016-03-11 LAB — D-DIMER, QUANTITATIVE (NOT AT ARMC): D DIMER QUANT: 0.74 ug{FEU}/mL — AB (ref 0.00–0.50)

## 2016-03-11 LAB — TSH: TSH: 1.752 u[IU]/mL (ref 0.350–4.500)

## 2016-03-11 MED ORDER — NICOTINE 21 MG/24HR TD PT24
21.0000 mg | MEDICATED_PATCH | Freq: Every day | TRANSDERMAL | Status: DC
  Administered 2016-03-11 – 2016-03-13 (×2): 21 mg via TRANSDERMAL
  Filled 2016-03-11 (×3): qty 1

## 2016-03-11 MED ORDER — ONDANSETRON HCL 4 MG/2ML IJ SOLN: 4.0000 mg | Freq: Four times a day (QID) | INTRAMUSCULAR | Status: DC | PRN

## 2016-03-11 MED ORDER — IOPAMIDOL (ISOVUE-370) INJECTION 76%
INTRAVENOUS | Status: AC
  Administered 2016-03-11: 100 mL
  Filled 2016-03-11: qty 100

## 2016-03-11 MED ORDER — ZOLPIDEM TARTRATE 5 MG PO TABS: 5.0000 mg | ORAL_TABLET | Freq: Every evening | ORAL | Status: DC | PRN

## 2016-03-11 MED ORDER — ENOXAPARIN SODIUM 40 MG/0.4ML ~~LOC~~ SOLN
40.0000 mg | SUBCUTANEOUS | Status: DC
  Administered 2016-03-11 – 2016-03-12 (×2): 40 mg via SUBCUTANEOUS
  Filled 2016-03-11 (×2): qty 0.4

## 2016-03-11 MED ORDER — NITROGLYCERIN 0.4 MG/SPRAY TL SOLN
1.0000 | Status: DC | PRN
  Filled 2016-03-11: qty 4.9

## 2016-03-11 MED ORDER — IOPAMIDOL (ISOVUE-370) INJECTION 76%
INTRAVENOUS | Status: AC
  Filled 2016-03-11: qty 100

## 2016-03-11 MED ORDER — GI COCKTAIL ~~LOC~~: 30.0000 mL | Freq: Four times a day (QID) | ORAL | Status: DC | PRN

## 2016-03-11 MED ORDER — ATORVASTATIN CALCIUM 40 MG PO TABS: 40.0000 mg | ORAL_TABLET | Freq: Every day | ORAL | Status: DC

## 2016-03-11 MED ORDER — IPRATROPIUM-ALBUTEROL 0.5-2.5 (3) MG/3ML IN SOLN: 3.0000 mL | Freq: Four times a day (QID) | RESPIRATORY_TRACT | Status: DC | PRN

## 2016-03-11 MED ORDER — ASPIRIN 81 MG PO CHEW
81.0000 mg | CHEWABLE_TABLET | Freq: Every day | ORAL | Status: DC
  Administered 2016-03-12 – 2016-03-13 (×2): 81 mg via ORAL
  Filled 2016-03-11 (×2): qty 1

## 2016-03-11 MED ORDER — ASPIRIN 81 MG PO TABS: 81.0000 mg | ORAL_TABLET | Freq: Every day | ORAL | Status: DC

## 2016-03-11 MED ORDER — PNEUMOCOCCAL VAC POLYVALENT 25 MCG/0.5ML IJ INJ
0.5000 mL | INJECTION | INTRAMUSCULAR | Status: DC
  Filled 2016-03-11: qty 0.5

## 2016-03-11 MED ORDER — LISINOPRIL 40 MG PO TABS
40.0000 mg | ORAL_TABLET | Freq: Every day | ORAL | Status: DC
  Administered 2016-03-12 – 2016-03-13 (×2): 40 mg via ORAL
  Filled 2016-03-11 (×3): qty 1

## 2016-03-11 MED ORDER — INSULIN ASPART 100 UNIT/ML ~~LOC~~ SOLN
0.0000 [IU] | Freq: Three times a day (TID) | SUBCUTANEOUS | Status: DC
  Administered 2016-03-13: 2 [IU] via SUBCUTANEOUS
  Administered 2016-03-13: 1 [IU] via SUBCUTANEOUS

## 2016-03-11 MED ORDER — MORPHINE SULFATE (PF) 2 MG/ML IV SOLN: 2.0000 mg | INTRAVENOUS | Status: DC | PRN

## 2016-03-11 MED ORDER — ACETAMINOPHEN 325 MG PO TABS
650.0000 mg | ORAL_TABLET | ORAL | Status: DC | PRN
  Administered 2016-03-13: 650 mg via ORAL
  Filled 2016-03-11: qty 2

## 2016-03-11 NOTE — ED Triage Notes (Signed)
Pt sts left sided CP that is intermittent in nature and woke him from sleep this am

## 2016-03-11 NOTE — H&P (Signed)
History and Physical  Jose Deleon YTK:[id number] DOB: 08/01/1960 DOA: 03/11/2016  Referring physician: Kathrynn Humble, MD  PCP: Minerva Ends, MD   Chief Complaint: Chest Pain  HPI: Jose Deleon is a 55 y.o. male  Jose Deleon is a 55 y.o. male  with a PMH of DM, HTN, and pulmonary TB in 1991 who presents to the Emergency Department complaining of left sided chest pain that woke him from his sleep at 3 am this morning. Pain is constant, however it will intensify for a minute or so where pain is described as sharp and "makes me jolt up like a reflex" then return to a dull ache. He describes the pain as 8/10 in severity. He denies shortness of breath. He reports that breathing does not affect the pain. The pain presented spontaneously. The patient denies heavy lifting.  The patient denies radiation of the pain into the left arm but does endorse pain in the left shoulder. No change with exertion or movement. No shortness of breath, headache, visual changes, n/v, diaphoresis. Patient reports nasal congestion over the last 3-4 days. He denies fever and chills.  He has a chronic smoker's cough but unchanged recently.  Risk factors: HTN, DM, + smoker. Mother died of MI at 82. No hx of heart disease.  Associated symptoms include cough. Pertinent negatives include no abdominal pain, no back pain, no diaphoresis, no fever, no headaches, no nausea, no palpitations, no shortness of breath and no vomiting.    Review of Systems:  Constitutional: Negative for chills, diaphoresis and fever.  HENT: Positive for congestion.   Eyes: Negative for visual disturbance.  Respiratory: Positive for cough. Negative for shortness of breath.   Cardiovascular: Positive for chest pain. Negative for palpitations and leg swelling.  Gastrointestinal: Negative for abdominal pain, nausea and vomiting.  Genitourinary: Negative for dysuria.  Musculoskeletal: Negative for back pain and neck pain.  Skin: Negative for rash.    Neurological: Negative for headache All other systems reviewed and are negative.  Past Medical History:  Diagnosis Date  . Diabetes mellitus without complication ([place]) 5573  . FH: heart disease   . Hypertension 2015  . TB (pulmonary tuberculosis) 1991   History reviewed. No pertinent surgical history. Social History:  reports that he has been smoking Cigarettes.  He has been smoking about 0.25 packs per day. He does not have any smokeless tobacco history on file. He reports that he drinks alcohol. He reports that he does not use drugs.  No Known Allergies  Family History  Problem Relation Age of Onset  . Hypertension Mother   . Hypertension Father   The patient reports a strong FH of heart disease.    Prior to Admission medications   Medication Sig Start Date End Date Taking? Authorizing Provider  aspirin 81 MG tablet 81 mg daily.   Yes Historical Provider, MD  Cinnamon 500 MG TABS Take by mouth.   Yes Historical Provider, MD  lisinopril (PRINIVIL,ZESTRIL) 40 MG tablet Take 1 tablet (40 mg total) by mouth daily. 10/08/15  Yes Boykin Nearing, MD  metFORMIN (GLUCOPHAGE) 850 MG tablet Take 1 tablet (850 mg total) by mouth daily with breakfast. 10/08/15  Yes Josalyn Funches, MD  sildenafil (VIAGRA) 100 MG tablet Take 0.5-1 tablets (50-100 mg total) by mouth daily as needed for erectile dysfunction. 10/08/15  Yes Josalyn Funches, MD  atorvastatin (LIPITOR) 40 MG tablet Take 1 tablet (40 mg total) by mouth daily. Patient not taking: Reported on 03/11/2016 10/09/15  Boykin Nearing, MD  Blood Glucose Monitoring Suppl (TRUERESULT BLOOD GLUCOSE) W/DEVICE KIT 1 each by Does not apply route daily. 09/08/14   Josalyn Funches, MD  diphenhydrAMINE (BENADRYL) 25 MG tablet Take 1-2 tablets (25-50 mg total) by mouth every 6 (six) hours as needed for sleep. Patient not taking: Reported on 03/11/2016 10/08/15   Boykin Nearing, MD  glucose blood (TRUETEST TEST) test strip 1 each by Other route 2 (two)  times daily. Use as instructed 09/08/14   Josalyn Funches, MD  terbinafine (LAMISIL AT) 1 % cream Apply 1 application topically 2 (two) times daily. Patient not taking: Reported on 03/11/2016 10/08/15   Boykin Nearing, MD  TRUEPLUS LANCETS 28G MISC 1 each by Does not apply route 3 (three) times daily. Patient not taking: Reported on 03/11/2016 09/08/14   Boykin Nearing, MD   Physical Exam: Vitals:   03/11/16 1236 03/11/16 1238 03/11/16 1533  BP: 141/89  151/100  Pulse: 67  (!) 58  Resp: 18  (!) 115  Temp: 98.3 F (36.8 C)    TempSrc: Oral    SpO2: 96%  100%  Weight:  93 kg (205 lb)   Height:  _0  (1.905 m)      General exam: well built and nourished patient, lying comfortably supine on the gurney in no obvious distress.  Head, eyes and ENT: Nontraumatic and normocephalic. Pupils equally reacting to light and accommodation. Oral mucosa moist.  Neck: Supple. No JVD, carotid bruit or thyromegaly.  Lymphatics: No lymphadenopathy.  Respiratory system: Clear to auscultation. No increased work of breathing.  No rash on chest or chest wall tenderness.    Cardiovascular system: S1 and S2 heard, RRR. No JVD, murmurs, gallops, clicks or pedal edema.  Gastrointestinal system: Abdomen is nondistended, soft and nontender. Normal bowel sounds heard. No organomegaly or masses appreciated.  Central nervous system: Alert and oriented. No focal neurological deficits.  Extremities: Peripheral pulses symmetrically felt. No cyanosis or pretibial edema.    Skin: No rashes or acute findings.  Musculoskeletal system: Negative exam.  Psychiatry: Pleasant and cooperative.   Labs on Admission:  Basic Metabolic Panel:  Recent Labs Lab 03/11/16 1245  NA 136  K 4.2  CL 105  CO2 26  GLUCOSE 102*  BUN 12  CREATININE 1.05  CALCIUM 9.1   Liver Function Tests: No results for input(s): AST, ALT, ALKPHOS, BILITOT, PROT, ALBUMIN in the last 168 hours. No results for input(s): LIPASE, AMYLASE  in the last 168 hours. No results for input(s): AMMONIA in the last 168 hours. CBC:  Recent Labs Lab 03/11/16 1245  WBC 9.0  HGB 14.8  HCT 45.9  MCV 93.9  PLT 249   Cardiac Enzymes: No results for input(s): CKTOTAL, CKMB, CKMBINDEX, TROPONINI in the last 168 hours.  BNP (last 3 results) No results for input(s): PROBNP in the last 8760 hours. CBG: No results for input(s): GLUCAP in the last 168 hours.  Radiological Exams on Admission: Dg Chest 2 View  Result Date: 03/11/2016 CLINICAL DATA:  Left upper chest pain since 3 a.m. History of hypertension diabetes mellitus. EXAM: CHEST  2 VIEW COMPARISON:  None. FINDINGS: The lungs are well inflated. Cardiomediastinal contours are normal. No pneumothorax or sizable pleural effusion. There is right lung volume loss with right apical airspace opacity. The left lung is clear. No pulmonary edema. IMPRESSION: Right upper lobe airspace opacities with associated volume loss. Atelectasis is favored, though pneumonia would be difficult to exclude. Electronically Signed   By: Ulyses Jarred  M.D.   On: 03/11/2016 13:39   EKG: Independently reviewed and abnormal.   Assessment/Plan Principal Problem:   Chest pain at rest Active Problems:   Diabetes mellitus without complication ([place])   Hypertension   Current smoker   Insomnia   STRONG FH: heart disease  1. Atypical chest pain with risk factors for heart disease-heart score 5. Admit for observation, continuous telemetry, cycle troponin, provide oxygen, nitroglycerin, morphine, aspirin. Given the abrupt onset of symptoms and the persistence of symptoms will get CT chest rule out pulmonary embolus. Nothing by mouth after midnight with arrangements for nuclear stress testing in the morning. Repeat serial EKG. 2. Diabetes mellitus, type II, non-insulin-requiring-check hemoglobin A1c, hold metformin while in hospital, monitor blood glucose and provide supplemental sliding scale coverage for elevated blood  glucose readings, carb modified diet. 3. Hypertension-reported as suboptimally controlled however recent blood pressures have been well. Monitor. 4. Dyslipidemia-check lipid panel, continue atorvastatin daily. 5. Chronic active nicotine dependence-counsel at bedside regarding tobacco cessation and will provide nicotine patch while in hospital. 6. Abnormal EKG and Abnormal CXR - serial EKG testing and continuous telemetry monitoring ordered, the patient does not have clinical symptoms or findings of pneumonia, ordered incentive spirometry, tobacco cessation and repeat chest exam 8/23.  I suspect that he has some atelectasis.  Follow CT chest results.    DVT Prophylaxis: enoxaparin Code Status: FULL  Family Communication: none at bedside  Disposition Plan: TBD   Time spent: 45 mins  Irwin Brakeman, MD [facility] Pager [phone]  If 7PM-7AM, please contact night-coverage [email or web address] Password [facility] 03/11/2016, 5:26 PM

## 2016-03-11 NOTE — ED Provider Notes (Signed)
[facility] DEPT Provider Note   CSN: [id number] Arrival date & time: 03/11/16  1230     History   Chief Complaint Chief Complaint  Patient presents with  . Chest Pain    HPI Jose Deleon is a 55 y.o. male.  The history is provided by the patient and medical records. No language interpreter was used.  Chest Pain   Associated symptoms include cough. Pertinent negatives include no abdominal pain, no back pain, no diaphoresis, no fever, no headaches, no nausea, no palpitations, no shortness of breath and no vomiting.   Jose Deleon is a 55 y.o. male  with a PMH of DM, HTN, and pulmonary TB in 1991 who presents to the Emergency Department complaining of left sided chest pain that woke him from his sleep at 3 am this morning. Pain is constant, however it will intensify for a minute or so where pain is described as sharp and "makes me jolt up like a reflex" then return to a dull ache. No change with exertion or movement. No shortness of breath, headache, visual changes, n/v, diaphoresis. Patient endorses nasal congestion over the last 3-4 days. Also endorses a cough, but states no change from usual daily "smokers cough" Took aspirin at onset and later in the morning with no relief. No alleviating or aggravating factors noted. No hx of similar sxs.   Risk factors: HTN, DM, + smoker. Mother died of MI at 15. No hx of heart disease.   Past Medical History:  Diagnosis Date  . Diabetes mellitus without complication ([place]) 1856  . FH: heart disease   . Hypertension 2015  . TB (pulmonary tuberculosis) 1991    Patient Active Problem List   Diagnosis Date Noted  . Chest pain at rest 03/11/2016  . Chest pain with high risk for cardiac etiology 03/11/2016  . STRONG FH: heart disease   . Tinea pedis of right foot 10/08/2015  . Insomnia 10/08/2015  . 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 Dental abscess 09/08/2014  . Current smoker 09/08/2014  . Onychomycosis 09/08/2014  .  Diabetes mellitus without complication ([place])   . Hypertension     History reviewed. No pertinent surgical history.     Home Medications    Prior to Admission medications   Medication Sig Start Date End Date Taking? Authorizing Provider  aspirin 81 MG tablet 81 mg daily.   Yes Historical Provider, MD  Cinnamon 500 MG TABS Take by mouth.   Yes Historical Provider, MD  lisinopril (PRINIVIL,ZESTRIL) 40 MG tablet Take 1 tablet (40 mg total) by mouth daily. 10/08/15  Yes Boykin Nearing, MD  metFORMIN (GLUCOPHAGE) 850 MG tablet Take 1 tablet (850 mg total) by mouth daily with breakfast. 10/08/15  Yes Josalyn Funches, MD  sildenafil (VIAGRA) 100 MG tablet Take 0.5-1 tablets (50-100 mg total) by mouth daily as needed for erectile dysfunction. 10/08/15  Yes Josalyn Funches, MD  atorvastatin (LIPITOR) 40 MG tablet Take 1 tablet (40 mg total) by mouth daily. Patient not taking: Reported on 03/11/2016 10/09/15   Boykin Nearing, MD  Blood Glucose Monitoring Suppl (TRUERESULT BLOOD GLUCOSE) W/DEVICE KIT 1 each by Does not apply route daily. 09/08/14   Josalyn Funches, MD  diphenhydrAMINE (BENADRYL) 25 MG tablet Take 1-2 tablets (25-50 mg total) by mouth every 6 (six) hours as needed for sleep. Patient not taking: Reported on 03/11/2016 10/08/15   Boykin Nearing, MD  glucose blood (TRUETEST TEST) test strip 1 each by Other route 2 (two) times daily. Use as instructed  09/08/14   Josalyn Funches, MD  terbinafine (LAMISIL AT) 1 % cream Apply 1 application topically 2 (two) times daily. Patient not taking: Reported on 03/11/2016 10/08/15   Boykin Nearing, MD  TRUEPLUS LANCETS 28G MISC 1 each by Does not apply route 3 (three) times daily. Patient not taking: Reported on 03/11/2016 09/08/14   Boykin Nearing, MD    Family History Family History  Problem Relation Age of Onset  . Hypertension Mother   . Hypertension Father     Social History Social History  Substance Use Topics  . Smoking status: Light  Tobacco Smoker    Packs/day: 0.25    Types: Cigarettes  . Smokeless tobacco: Not on file  . Alcohol use Yes     Comment: occ     Allergies   Review of patient's allergies indicates no known allergies.   Review of Systems Review of Systems  Constitutional: Negative for chills, diaphoresis and fever.  HENT: Positive for congestion.   Eyes: Negative for visual disturbance.  Respiratory: Positive for cough. Negative for shortness of breath.   Cardiovascular: Positive for chest pain. Negative for palpitations and leg swelling.  Gastrointestinal: Negative for abdominal pain, nausea and vomiting.  Genitourinary: Negative for dysuria.  Musculoskeletal: Negative for back pain and neck pain.  Skin: Negative for rash.  Neurological: Negative for headaches.     Physical Exam Updated Vital Signs BP (!) 152/91 (BP Location: Right Arm)   Pulse 61   Temp 98.7 F (37.1 C) (Oral)   Resp 18   Ht 6' 3"  (1.905 m)   Wt 91.4 kg   SpO2 99%   BMI 25.19 kg/m   Physical Exam  Constitutional: He is oriented to person, place, and time.  WDWN pleasant male speaking in full sentences and in NAD.   HENT:  Head: Normocephalic and atraumatic.  Cardiovascular: Normal rate, regular rhythm, normal heart sounds and intact distal pulses.  Exam reveals no gallop and no friction rub.   No murmur heard. Pulmonary/Chest: Effort normal and breath sounds normal. No respiratory distress. He has no wheezes. He has no rales. He exhibits no tenderness.  Abdominal: Soft. He exhibits no distension. There is no tenderness.  Musculoskeletal: He exhibits no edema.  No tenderness to palpation of chest wall or left shoulder. Shoulder with full ROM without pain.   Neurological: He is alert and oriented to person, place, and time.  Skin: Skin is warm and dry. He is not diaphoretic.  Nursing note and vitals reviewed.    ED Treatments / Results  Labs (all labs ordered are listed, but only abnormal results are  displayed) Labs Reviewed  BASIC METABOLIC PANEL - Abnormal; Notable for the following:       Result Value   Glucose, Bld 102 (*)    All other components within normal limits  D-DIMER, QUANTITATIVE (NOT AT [facility]) - Abnormal; Notable for the following:    D-Dimer, Quant 0.74 (*)    All other components within normal limits  CBC  LIPID PANEL  HEMOGLOBIN A1C  TSH  SEDIMENTATION RATE  VITAMIN D 25 HYDROXY (VIT D DEFICIENCY, FRACTURES)  I-STAT TROPOININ, ED  I-STAT TROPOININ, ED    EKG  EKG Interpretation  Date/Time:  Tuesday March 11 2016 12:37:47 EDT Ventricular Rate:  65 PR Interval:  154 QRS Duration: 92 QT Interval:  426 QTC Calculation: 443 R Axis:   28 Text Interpretation:  Normal sinus rhythm Incomplete right bundle branch block Borderline ECG Nonspecific ST elevation in apical  leads Reconfirmed by Kathrynn Humble, MD, Thelma Comp [phone]) on 03/11/2016 4:53:24 PM       Radiology Dg Chest 2 View  Result Date: 03/11/2016 CLINICAL DATA:  Left upper chest pain since 3 a.m. History of hypertension diabetes mellitus. EXAM: CHEST  2 VIEW COMPARISON:  None. FINDINGS: The lungs are well inflated. Cardiomediastinal contours are normal. No pneumothorax or sizable pleural effusion. There is right lung volume loss with right apical airspace opacity. The left lung is clear. No pulmonary edema. IMPRESSION: Right upper lobe airspace opacities with associated volume loss. Atelectasis is favored, though pneumonia would be difficult to exclude. Electronically Signed   By: Ulyses Jarred M.D.   On: 03/11/2016 13:39   Ct Angio Chest Aorta W/cm &/or Wo/cm  Result Date: 03/11/2016 CLINICAL DATA:  Left-sided chest pain history of TB in 1991. EXAM: CT ANGIOGRAPHY CHEST WITH CONTRAST TECHNIQUE: Multidetector CT imaging of the chest was performed using the standard protocol during bolus administration of intravenous contrast. Multiplanar CT image reconstructions and MIPs were obtained to evaluate the vascular  anatomy. CONTRAST:  100 cc Isovue 370 intravenously. COMPARISON:  Chest radiograph 03/11/2016 FINDINGS: Mediastinum/Lymph Nodes: No pulmonary emboli or thoracic aortic dissection identified. No masses or pathologically enlarged lymph nodes identified. The heart is normal in size. There is no pericardial effusion. Lungs/Pleura: There are upper lobe predominant chronic interstitial lung changes with traction bronchiectasis and thick-walled bulla formation, heavily affecting the right upper lobe. No evidence of acute airspace consolidation. There is biapical subpleural thickening which may also represent scarring. Upper abdomen: No acute findings. Musculoskeletal: No chest wall mass or suspicious bone lesions identified. Review of the MIP images confirms the above findings. IMPRESSION: Upper lobe predominant chronic interstitial lung changes, including thickening and architectural distortion of the interstitial markings, bronchial thickening and bronchiectasis and thick walled bulla formation. These changes may be sequela of prior granulomatous disease, which is supported by patient has history of prior TB. Bronchoalveolar carcinoma is less favored, however is on the differential diagnosis, particularly in the medial aspect of the right upper lobe. No evidence of pulmonary embolus. Electronically Signed   By: Fidela Salisbury M.D.   On: 03/11/2016 19:24    Procedures Procedures (including critical care time)  Medications Ordered in ED Medications  lisinopril (PRINIVIL,ZESTRIL) tablet 40 mg (not administered)  acetaminophen (TYLENOL) tablet 650 mg (not administered)  ondansetron (ZOFRAN) injection 4 mg (not administered)  enoxaparin (LOVENOX) injection 40 mg (40 mg Subcutaneous Given 03/11/16 2014)  morphine 2 MG/ML injection 2 mg (not administered)  gi cocktail (Maalox,Lidocaine,Donnatal) (not administered)  zolpidem (AMBIEN) tablet 5 mg (not administered)  insulin aspart (novoLOG) injection 0-9 Units  (not administered)  nicotine (NICODERM CQ - dosed in mg/24 hours) patch 21 mg (21 mg Transdermal Patch Applied 03/11/16 2014)  nitroGLYCERIN (NITROLINGUAL) 0.4 MG/SPRAY spray 1 spray (not administered)  ipratropium-albuterol (DUONEB) 0.5-2.5 (3) MG/3ML nebulizer solution 3 mL (not administered)  iopamidol (ISOVUE-370) 76 % injection (not administered)  aspirin chewable tablet 81 mg (not administered)  iopamidol (ISOVUE-370) 76 % injection (100 mLs  Contrast Given 03/11/16 1845)     Initial Impression / Assessment and Plan / ED Course  I have reviewed the triage vital signs and the nursing notes.  Pertinent labs & imaging results that were available during my care of the patient were reviewed by me and considered in my medical decision making (see chart for details).  Clinical Course   Tyion Boylen is a 55 y.o. male who presents to ED for constant left-sided chest  pain and multiple risk factors for ACS. Heart score of 5. Labs reviewed and reassuring. Trop negative x 2. CXR reviewed - clinically patient not exhibiting signs of PNA and lungs clear. Infectious etiology unlikely. EKG reviewed with nonspecific findings. Given risk factors and no clear source of left sided chest pain, will admit for obs chest pain rule out.   Patient discussed with Dr. Kathrynn Humble who agrees with treatment plan.   Final Clinical Impressions(s) / ED Diagnoses   Final diagnoses:  FH: heart disease  Chest pain  Acute dyspnea    New Prescriptions Current Discharge Medication List        [facility] [place], PA-C 03/11/16 2022    Varney Biles, MD 03/12/16 1517

## 2016-03-11 NOTE — ED Notes (Signed)
PA at bedside.

## 2016-03-12 ENCOUNTER — Observation Stay (HOSPITAL_BASED_OUTPATIENT_CLINIC_OR_DEPARTMENT_OTHER): Payer: Self-pay

## 2016-03-12 ENCOUNTER — Encounter (HOSPITAL_COMMUNITY): Payer: Self-pay | Admitting: Physician Assistant

## 2016-03-12 ENCOUNTER — Observation Stay (HOSPITAL_COMMUNITY): Payer: Self-pay

## 2016-03-12 DIAGNOSIS — I509 Heart failure, unspecified: Secondary | ICD-10-CM

## 2016-03-12 DIAGNOSIS — R079 Chest pain, unspecified: Secondary | ICD-10-CM

## 2016-03-12 LAB — NM MYOCAR MULTI W/SPECT W/WALL MOTION / EF: CHL CUP RESTING HR STRESS: 59 {beats}/min

## 2016-03-12 LAB — ECHOCARDIOGRAM COMPLETE
HEIGHTINCHES: 75 in
WEIGHTICAEL: 3214.4 [oz_av]

## 2016-03-12 LAB — GLUCOSE, CAPILLARY
GLUCOSE-CAPILLARY: 148 mg/dL — AB (ref 65–99)
Glucose-Capillary: 106 mg/dL — ABNORMAL HIGH (ref 65–99)
Glucose-Capillary: 122 mg/dL — ABNORMAL HIGH (ref 65–99)
Glucose-Capillary: 109 mg/dL — ABNORMAL HIGH (ref 65–99)

## 2016-03-12 MED ORDER — ASPIRIN 81 MG PO CHEW
81.0000 mg | CHEWABLE_TABLET | ORAL | Status: AC
  Administered 2016-03-13: 81 mg via ORAL
  Filled 2016-03-12: qty 1

## 2016-03-12 MED ORDER — SODIUM CHLORIDE 0.9% FLUSH: 3.0000 mL | INTRAVENOUS | Status: DC | PRN

## 2016-03-12 MED ORDER — REGADENOSON 0.4 MG/5ML IV SOLN
INTRAVENOUS | Status: AC
  Administered 2016-03-12: 0.4 mg via INTRAVENOUS
  Filled 2016-03-12: qty 5

## 2016-03-12 MED ORDER — TECHNETIUM TC 99M TETROFOSMIN IV KIT
10.0000 | PACK | Freq: Once | INTRAVENOUS | Status: AC | PRN
  Administered 2016-03-12: 10 via INTRAVENOUS

## 2016-03-12 MED ORDER — TECHNETIUM TC 99M TETROFOSMIN IV KIT
30.0000 | PACK | Freq: Once | INTRAVENOUS | Status: AC | PRN
  Administered 2016-03-12: 30 via INTRAVENOUS

## 2016-03-12 MED ORDER — SODIUM CHLORIDE 0.9% FLUSH
3.0000 mL | Freq: Two times a day (BID) | INTRAVENOUS | Status: DC
  Administered 2016-03-12: 3 mL via INTRAVENOUS

## 2016-03-12 MED ORDER — SODIUM CHLORIDE 0.9 % WEIGHT BASED INFUSION
3.0000 mL/kg/h | INTRAVENOUS | Status: DC
  Administered 2016-03-13: 3 mL/kg/h via INTRAVENOUS

## 2016-03-12 MED ORDER — SODIUM CHLORIDE 0.9 % IV SOLN: 250.0000 mL | INTRAVENOUS | Status: DC | PRN

## 2016-03-12 MED ORDER — REGADENOSON 0.4 MG/5ML IV SOLN
0.4000 mg | Freq: Once | INTRAVENOUS | Status: AC
Start: 2016-03-12 — End: 2016-03-12
  Administered 2016-03-12: 0.4 mg via INTRAVENOUS
  Filled 2016-03-12: qty 5

## 2016-03-12 MED ORDER — SODIUM CHLORIDE 0.9 % WEIGHT BASED INFUSION: 1.0000 mL/kg/h | INTRAVENOUS | Status: DC

## 2016-03-12 NOTE — Progress Notes (Signed)
  Echocardiogram 2D Echocardiogram has been performed.  Janalyn HarderWest, Issabela Lesko R 03/12/2016, 3:58 PM

## 2016-03-12 NOTE — Progress Notes (Addendum)
PROGRESS NOTE  Jose Deleon ZOX:[id number] DOB: Jun 24, 1961 DOA: 03/11/2016 PCP: Lora Paula, MD  HPI/Recap of past 24 hours:  currently chest pain free  Assessment/Plan: Principal Problem:   Chest pain at rest Active Problems:   Diabetes mellitus without complication (HCC)   Hypertension   Current smoker   Insomnia   STRONG FH: heart disease   Chest pain with high risk for cardiac etiology   1. Atypical chest pain with risk factors for heart disease-heart score 5. Telemetry with sinus bradycardia with occasional 2s sinus pause,  Troponin negative x3, nitroglycerin, morphine, aspirin. Given the abrupt onset of symptoms and the persistence of symptoms CTA chest was ordered which was negative for PE.rule out pulmonary embolus.low risk  nuclear stress  On 8/23, however, echo with concerning features for which cardiology recommended cardiac cath on 8/24. 2. Diabetes mellitus, type II, non-insulin-requiring-check hemoglobin A1c, hold metformin while in hospital, monitor blood glucose and provide supplemental sliding scale coverage for elevated blood glucose readings, carb modified diet. 3. Hypertension-reported as suboptimally controlled however recent blood pressures have been well. Monitor. 4. Dyslipidemia-check lipid panel, continue atorvastatin daily. 5. Chronic active nicotine dependence-counsel at bedside regarding tobacco cessation and will provide nicotine patch while in hospital. 6. 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   DVT Prophylaxis: enoxaparin Code Status: FULL  Family Communication: none at bedside  Disposition Plan: pending     Consultants:  cardiology  Procedures:  Cardiac stress test on 8/23  Cardiac cath on 8/24  Antibiotics:  none   Objective: BP (!) 150/82 (BP Location: Left Arm)   Pulse 62   Temp 98.5 F (36.9 C) (Oral)   Resp 18   Ht 6\' 3"  (1.905 m)   Wt 91.1 kg (200 lb 14.4  oz)   SpO2 95%   BMI 25.11 kg/m   Intake/Output Summary (Last 24 hours) at 03/12/16 1824 Last data filed at 03/12/16 1045  Gross per 24 hour  Intake              240 ml  Output                0 ml  Net              240 ml   Filed Weights   03/11/16 1238 03/11/16 1911 03/12/16 0500  Weight: 93 kg (205 lb) 91.4 kg (201 lb 8 oz) 91.1 kg (200 lb 14.4 oz)    Exam:   General:  NAD  Cardiovascular: RRR  Respiratory: CTABL  Abdomen: Soft/ND/NT, positive BS  Musculoskeletal: No Edema  Neuro: aaox3  Data Reviewed: Basic Metabolic Panel:  Recent Labs Lab 03/11/16 1245  NA 136  K 4.2  CL 105  CO2 26  GLUCOSE 102*  BUN 12  CREATININE 1.05  CALCIUM 9.1   Liver Function Tests: No results for input(s): AST, ALT, ALKPHOS, BILITOT, PROT, ALBUMIN in the last 168 hours. No results for input(s): LIPASE, AMYLASE in the last 168 hours. No results for input(s): AMMONIA in the last 168 hours. CBC:  Recent Labs Lab 03/11/16 1245  WBC 9.0  HGB 14.8  HCT 45.9  MCV 93.9  PLT 249   Cardiac Enzymes:   No results for input(s): CKTOTAL, CKMB, CKMBINDEX, TROPONINI in the last 168 hours. BNP (last 3 results) No results for input(s): BNP in the last 8760 hours.  ProBNP (last 3 results) No results for input(s): PROBNP in the  last 8760 hours.  CBG:  Recent Labs Lab 03/12/16 0741 03/12/16 1121 03/12/16 1610  GLUCAP 106* 148* 122*    No results found for this or any previous visit (from the past 240 hour(s)).   Studies: Nm Myocar Multi W/spect W/wall Motion / Ef  Result Date: 03/12/2016 CLINICAL DATA:  Chest pain EXAM: MYOCARDIAL IMAGING WITH SPECT (REST AND PHARMACOLOGIC-STRESS) GATED LEFT VENTRICULAR WALL MOTION STUDY LEFT VENTRICULAR EJECTION FRACTION TECHNIQUE: Standard myocardial SPECT imaging was performed after resting intravenous injection of 10 mCi Tc-7292m tetrofosmin. Subsequently, intravenous infusion of Lexiscan was performed under the supervision of the  Cardiology staff. At peak effect of the drug, 30 mCi Tc-6592m tetrofosmin was injected intravenously and standard myocardial SPECT imaging was performed. Quantitative gated imaging was also performed to evaluate left ventricular wall motion, and estimate left ventricular ejection fraction. COMPARISON:  None. FINDINGS: Perfusion: There is no evidence of stress-induced ischemia. There is a moderate-sized fixed perfusion defect at the lateral apex. There is also a large fixed perfusion defect involving much of the inferior wall. Wall Motion: There is global hypokinesis. Left Ventricular Ejection Fraction: 45 % End diastolic volume 124 ml End systolic volume 68 ml IMPRESSION: 1. No stress-induced ischemia. Fixed defects in the inferior wall and apex are noted. 2. Global hypokinesis. 3. Left ventricular ejection fraction 45% 4. Non invasive risk stratification*: Intermediate risk *2012 Appropriate Use Criteria for Coronary Revascularization Focused Update: J Am Coll Cardiol. 2012;59(9):857-881. http://content.dementiazones.comonlinejacc.org/article.aspx?articleid=1201161 Electronically Signed   By: Jolaine ClickArthur  Hoss M.D.   On: 03/12/2016 12:30   Ct Angio Chest Aorta W/cm &/or Wo/cm  Result Date: 03/11/2016 CLINICAL DATA:  Left-sided chest pain history of TB in 1991. EXAM: CT ANGIOGRAPHY CHEST WITH CONTRAST TECHNIQUE: Multidetector CT imaging of the chest was performed using the standard protocol during bolus administration of intravenous contrast. Multiplanar CT image reconstructions and MIPs were obtained to evaluate the vascular anatomy. CONTRAST:  100 cc Isovue 370 intravenously. COMPARISON:  Chest radiograph 03/11/2016 FINDINGS: Mediastinum/Lymph Nodes: No pulmonary emboli or thoracic aortic dissection identified. No masses or pathologically enlarged lymph nodes identified. The heart is normal in size. There is no pericardial effusion. Lungs/Pleura: There are upper lobe predominant chronic interstitial lung changes with traction  bronchiectasis and thick-walled bulla formation, heavily affecting the right upper lobe. No evidence of acute airspace consolidation. There is biapical subpleural thickening which may also represent scarring. Upper abdomen: No acute findings. Musculoskeletal: No chest wall mass or suspicious bone lesions identified. Review of the MIP images confirms the above findings. IMPRESSION: Upper lobe predominant chronic interstitial lung changes, including thickening and architectural distortion of the interstitial markings, bronchial thickening and bronchiectasis and thick walled bulla formation. These changes may be sequela of prior granulomatous disease, which is supported by patient has history of prior TB. Bronchoalveolar carcinoma is less favored, however is on the differential diagnosis, particularly in the medial aspect of the right upper lobe. No evidence of pulmonary embolus. Electronically Signed   By: Ted Mcalpineobrinka  Dimitrova M.D.   On: 03/11/2016 19:24    Scheduled Meds: . aspirin  81 mg Oral Daily  . enoxaparin (LOVENOX) injection  40 mg Subcutaneous Q24H  . insulin aspart  0-9 Units Subcutaneous TID WC  . lisinopril  40 mg Oral Daily  . nicotine  21 mg Transdermal Daily  . pneumococcal 23 valent vaccine  0.5 mL Intramuscular Tomorrow-1000    Continuous Infusions:    Time spent: 25mins  Burlin Mcnair MD, PhD  [facility] Pager [phone]. If 7PM-7AM, please contact night-coverage  at [email or web address], password [email or web address] 03/12/2016, 6:24 PM  LOS: 0 days

## 2016-03-12 NOTE — Consult Note (Signed)
CARDIOLOGY CONSULT NOTE   Patient ID: Jose Deleon MRN: [id number] DOB/AGE: 1960/12/30 55 y.o.  Admit date: 03/11/2016  Primary Physician   Minerva Ends, MD Primary Cardiologist   New Reason for Consultation   Chest pain Requesting MD: Dr Erlinda Hong  Jose Deleon is a 55 y.o. year old male with a history of DM, GERD, HLD, HTN, TB. No previous cardiac evaluation.  He was admitted 08/22 with chest pain, cards asked to see.   He woke with chest pain at about 3 am yesterday. It was a spasm type of pain, brief, but it kept coming every 3-4 minutes. He took ASA, no help. He ate breakfast a few hours later, took usual am meds, no help. No SOB or pain with inspiration. No N&V or diaphoresis. 8/10. Averaging 10-12 x hour all day. Finally came to the ER last pm. The pain has gradually eased off, coming less frequently and not as severe.  He has never had it before.   He is generally moderately active, can walk 8 blocks without stopping, cut the grass and do other things around the house. He will occasionally get SOB with these things but has never had chest pain.   He had TB in 1990, from a co-worker. He took multi-drug therapy for about a year and a half.     Past Medical History:  Diagnosis Date  . FH: heart disease   . GERD (gastroesophageal reflux disease)   . High cholesterol   . History of gout    "reaction from the TB RX"  . Hypertension 2015  . TB (pulmonary tuberculosis) 1990  . Type II diabetes mellitus ([place]) dx ~ 2015     Past Surgical History:  Procedure Laterality Date  . Chacra   "lanced"    No Known Allergies  I have reviewed the patient's current medications . aspirin  81 mg Oral Daily  . enoxaparin (LOVENOX) injection  40 mg Subcutaneous Q24H  . insulin aspart  0-9 Units Subcutaneous TID WC  . lisinopril  40 mg Oral Daily  . nicotine  21 mg Transdermal Daily  . pneumococcal 23 valent vaccine  0.5 mL Intramuscular Tomorrow-1000      acetaminophen, gi cocktail, ipratropium-albuterol, morphine injection, nitroGLYCERIN, ondansetron (ZOFRAN) IV, zolpidem  Medication Sig  aspirin 81 MG tablet 81 mg daily.  Cinnamon 500 MG TABS Take by mouth.  lisinopril (PRINIVIL,ZESTRIL) 40 MG tablet Take 1 tablet (40 mg total) by mouth daily.  metFORMIN (GLUCOPHAGE) 850 MG tablet Take 1 tablet (850 mg total) by mouth daily with breakfast.  sildenafil (VIAGRA) 100 MG tablet Take 0.5-1 tablets (50-100 mg total) by mouth daily as needed for erectile dysfunction.  atorvastatin (LIPITOR) 40 MG tablet Take 1 tablet (40 mg total) by mouth daily. Patient not taking: Reported on 03/11/2016  Blood Glucose Monitoring Suppl (TRUERESULT BLOOD GLUCOSE) W/DEVICE KIT 1 each by Does not apply route daily.  diphenhydrAMINE (BENADRYL) 25 MG tablet Take 1-2 tablets (25-50 mg total) by mouth every 6 (six) hours as needed for sleep. Patient not taking: Reported on 03/11/2016  glucose blood (TRUETEST TEST) test strip 1 each by Other route 2 (two) times daily. Use as instructed  terbinafine (LAMISIL AT) 1 % cream Apply 1 application topically 2 (two) times daily. Patient not taking: Reported on 03/11/2016  TRUEPLUS LANCETS 28G MISC 1 each by Does not apply route 3 (three) times daily. Patient not taking: Reported on 03/11/2016     Social  History   Social History  . Marital status: Single    Spouse name: N/A  . Number of children: N/A  . Years of education: N/A   Occupational History  . Retired Tax inspector    Social History Main Topics  . Smoking status: Former Smoker    Packs/day: 0.33    Years: 30.00    Types: Cigarettes    Quit date: 03/07/2016  . Smokeless tobacco: Never Used  . Alcohol use 2.4 oz/week    4 Cans of beer per week  . Drug use: No  . Sexual activity: Not Currently   Other Topics Concern  . Not on file   Social History Narrative  . No narrative on file    Family Status  Relation Status  . Mother Deceased  .  Father Deceased  . Maternal Grandmother    Family History  Problem Relation Age of Onset  . Hypertension Mother   . Heart attack Mother 41  . Hypertension Father 18  . Multiple sclerosis Father   . Stroke Maternal Grandmother      ROS:  Full 14 point review of systems complete and found to be negative unless listed above.  Physical Exam: Blood pressure (!) 150/81, pulse (!) 58, temperature 98.1 F (36.7 C), temperature source Oral, resp. rate 18, height 6' 3"  (1.905 m), weight 200 lb 14.4 oz (91.1 kg), SpO2 98 %.  General: Well developed, well nourished, male in no acute distress Head: Eyes PERRLA, No xanthomas.   Normocephalic and atraumatic, oropharynx without edema or exudate. Dentition: good Lungs: anterior rales, minimal exp wheeze Heart: HRRR S1 S2, no rub/gallop, no murmur. pulses are 2+ all 4 extrem.   Neck: No carotid bruits. No lymphadenopathy.  JVD not elevated Abdomen: Bowel sounds present, abdomen soft and non-tender without masses or hernias noted. Msk:  No spine or cva tenderness. No weakness, no joint deformities or effusions. Extremities: No clubbing or cyanosis. No edema.  Neuro: Alert and oriented X 3. No focal deficits noted. Psych:  Good affect, responds appropriately Skin: No rashes or lesions noted.  Labs:   Lab Results  Component Value Date   WBC 9.0 03/11/2016   HGB 14.8 03/11/2016   HCT 45.9 03/11/2016   MCV 93.9 03/11/2016   PLT 249 03/11/2016     Recent Labs Lab 03/11/16 1245  NA 136  K 4.2  CL 105  CO2 26  BUN 12  CREATININE 1.05  CALCIUM 9.1  GLUCOSE 102*    Recent Labs  03/11/16 1251 03/11/16 1636  TROPIPOC 0.00 0.00   Lab Results  Component Value Date   CHOL 203 (H) 03/11/2016   HDL 62 03/11/2016   LDLCALC 123 (H) 03/11/2016   TRIG 88 03/11/2016   Lab Results  Component Value Date   DDIMER 0.74 (H) 03/11/2016   TSH  Date/Time Value Ref Range Status  03/11/2016 07:54 PM 1.752 0.350 - 4.500 uIU/mL Final   ECG:   08/22 SR, IVCD (? Inc RBBB), no acute changes  Radiology:  Dg Chest 2 View Result Date: 03/11/2016 CLINICAL DATA:  Left upper chest pain since 3 a.m. History of hypertension diabetes mellitus. EXAM: CHEST  2 VIEW COMPARISON:  None. FINDINGS: The lungs are well inflated. Cardiomediastinal contours are normal. No pneumothorax or sizable pleural effusion. There is right lung volume loss with right apical airspace opacity. The left lung is clear. No pulmonary edema. IMPRESSION: Right upper lobe airspace opacities with associated volume loss. Atelectasis is favored, though pneumonia  would be difficult to exclude. Electronically Signed   By: Ulyses Jarred M.D.   On: 03/11/2016 13:39   Ct Angio Chest Aorta W/cm &/or Wo/cm Result Date: 03/11/2016 CLINICAL DATA:  Left-sided chest pain history of TB in 1991. EXAM: CT ANGIOGRAPHY CHEST WITH CONTRAST TECHNIQUE: Multidetector CT imaging of the chest was performed using the standard protocol during bolus administration of intravenous contrast. Multiplanar CT image reconstructions and MIPs were obtained to evaluate the vascular anatomy. CONTRAST:  100 cc Isovue 370 intravenously. COMPARISON:  Chest radiograph 03/11/2016 FINDINGS: Mediastinum/Lymph Nodes: No pulmonary emboli or thoracic aortic dissection identified. No masses or pathologically enlarged lymph nodes identified. The heart is normal in size. There is no pericardial effusion. Lungs/Pleura: There are upper lobe predominant chronic interstitial lung changes with traction bronchiectasis and thick-walled bulla formation, heavily affecting the right upper lobe. No evidence of acute airspace consolidation. There is biapical subpleural thickening which may also represent scarring. Upper abdomen: No acute findings. Musculoskeletal: No chest wall mass or suspicious bone lesions identified. Review of the MIP images confirms the above findings. IMPRESSION: Upper lobe predominant chronic interstitial lung changes, including  thickening and architectural distortion of the interstitial markings, bronchial thickening and bronchiectasis and thick walled bulla formation. These changes may be sequela of prior granulomatous disease, which is supported by patient has history of prior TB. Bronchoalveolar carcinoma is less favored, however is on the differential diagnosis, particularly in the medial aspect of the right upper lobe. No evidence of pulmonary embolus. Electronically Signed   By: Fidela Salisbury M.D.   On: 03/11/2016 19:24    ASSESSMENT AND PLAN:   The patient was seen today by Dr Acie Fredrickson, the patient evaluated and the data reviewed.  Principal Problem:   Chest pain at rest - enzymes and ECG not acute - sx are atypical, but he has multiple CRFs - inpt MV today  Otherwise, per IM Active Problems:   Diabetes mellitus without complication ([place])   Hypertension   Current smoker   Insomnia   STRONG FH: heart disease   Chest pain with high risk for cardiac etiology   Signed: Lenoard Aden 03/12/2016 8:22 AM Beeper [phone]  Co-Sign MD   Attending Note:   The patient was seen and examined.  Agree with assessment and plan as noted above.  Changes made to the above note as needed.  Patient seen and independently examined with Rosaria Ferries, PA .   We discussed all aspects of the encounter. I agree with the assessment and plan as stated above.  Pt presents with CP - like a jolt like sensatino Lasted off and on for hours. Troponin levels negative  Agree with plans for a Lexiscan myoview He may be able to go home if the myoview is low risk    I have spent a total of 40 minutes with patient reviewing hospital  notes , telemetry, EKGs, labs and examining patient as well as establishing an assessment and plan that was discussed with the patient. > 50% of time was spent in direct patient care.    Thayer Headings, Brooke Bonito., MD, [facility] 03/12/2016, 11:05 AM [address],  [place] Pager [phone]

## 2016-03-12 NOTE — Progress Notes (Signed)
Myoview completed. Pt tolerated well. Final report pending.  Cherita Hebel PA-C 03/12/2016 10:00 AM

## 2016-03-12 NOTE — Progress Notes (Signed)
Pt resting comfortably, denies any pain/discomfort this shift, breathing even and unlabored, no s/s of distress noted, will continue to monitor pt

## 2016-03-12 NOTE — Progress Notes (Signed)
    I have reviewed the nuclear images and have read the echocardiogram. In some views, the anteroapical wall appears hypokinetic.    He has no known hx of cad Given his symptoms and the mildly abnormal myoview and echo , I think we should proceed with cath tomorrow   I've discussed the risks, benefits and options with him. He understands and agrees to proceed.   Clear liquids for breakfast. Cath tomorrow afternoon.  Kristeen MissPhilip Taina Landry, MD  03/12/2016 6:35 PM    [facility] [address],  [address] [place], [place]  [address] Pager [phone] Phone: [phone]; Fax: [phone]

## 2016-03-13 ENCOUNTER — Encounter (HOSPITAL_COMMUNITY)
Admission: EM | Disposition: A | Discharge status: Home / Self Care | Payer: Self-pay | Source: Home / Self Care | Attending: Emergency Medicine

## 2016-03-13 ENCOUNTER — Encounter (HOSPITAL_COMMUNITY): Payer: Self-pay | Admitting: Cardiology

## 2016-03-13 HISTORY — PX: CARDIAC CATHETERIZATION: SHX172

## 2016-03-13 LAB — BASIC METABOLIC PANEL
ANION GAP: 8 (ref 5–15)
BUN: 13 mg/dL (ref 6–20)
CALCIUM: 8.8 mg/dL — AB (ref 8.9–10.3)
CO2: 24 mmol/L (ref 22–32)
Chloride: 106 mmol/L (ref 101–111)
Creatinine, Ser: 0.93 mg/dL (ref 0.61–1.24)
GFR calc Af Amer: >60 mL/min (ref >60)
GLUCOSE: 98 mg/dL (ref 65–99)
Potassium: 3.8 mmol/L (ref 3.5–5.1)
SODIUM: 138 mmol/L (ref 135–145)

## 2016-03-13 LAB — GLUCOSE, CAPILLARY
GLUCOSE-CAPILLARY: 158 mg/dL — AB (ref 65–99)
Glucose-Capillary: 136 mg/dL — ABNORMAL HIGH (ref 65–99)
Glucose-Capillary: 88 mg/dL (ref 65–99)

## 2016-03-13 LAB — HEMOGLOBIN A1C
HEMOGLOBIN A1C: 6.9 % — AB (ref 4.8–5.6)
MEAN PLASMA GLUCOSE: 151 mg/dL

## 2016-03-13 LAB — PROTIME-INR
INR: 0.97
Prothrombin Time: 12.8 seconds (ref 11.4–15.2)

## 2016-03-13 LAB — MAGNESIUM: MAGNESIUM: 2.1 mg/dL (ref 1.7–2.4)

## 2016-03-13 LAB — VITAMIN D 25 HYDROXY (VIT D DEFICIENCY, FRACTURES): VIT D 25 HYDROXY: 16.1 ng/mL — AB (ref 30.0–100.0)

## 2016-03-13 SURGERY — LEFT HEART CATH AND CORONARY ANGIOGRAPHY

## 2016-03-13 MED ORDER — SODIUM CHLORIDE 0.9 % IV SOLN: 250.0000 mL | INTRAVENOUS | Status: DC | PRN

## 2016-03-13 MED ORDER — MIDAZOLAM HCL 2 MG/2ML IJ SOLN
INTRAMUSCULAR | Status: AC
  Filled 2016-03-13: qty 2

## 2016-03-13 MED ORDER — LIDOCAINE HCL (PF) 1 % IJ SOLN
INTRAMUSCULAR | Status: DC | PRN
  Administered 2016-03-13: 2 mL via INTRADERMAL

## 2016-03-13 MED ORDER — HEPARIN (PORCINE) IN NACL 2-0.9 UNIT/ML-% IJ SOLN
INTRAMUSCULAR | Status: AC
  Filled 2016-03-13: qty 1500

## 2016-03-13 MED ORDER — LIDOCAINE HCL (PF) 1 % IJ SOLN
INTRAMUSCULAR | Status: AC
Start: 2016-03-13 — End: 2016-03-13
  Filled 2016-03-13: qty 30

## 2016-03-13 MED ORDER — IOPAMIDOL (ISOVUE-370) INJECTION 76%
INTRAVENOUS | Status: AC
  Filled 2016-03-13: qty 100

## 2016-03-13 MED ORDER — FENTANYL CITRATE (PF) 100 MCG/2ML IJ SOLN
INTRAMUSCULAR | Status: DC | PRN
  Administered 2016-03-13: 25 ug via INTRAVENOUS

## 2016-03-13 MED ORDER — HEPARIN SODIUM (PORCINE) 1000 UNIT/ML IJ SOLN
INTRAMUSCULAR | Status: AC
  Filled 2016-03-13: qty 1

## 2016-03-13 MED ORDER — VERAPAMIL HCL 2.5 MG/ML IV SOLN
INTRAVENOUS | Status: DC | PRN
  Administered 2016-03-13: 10 mL via INTRA_ARTERIAL

## 2016-03-13 MED ORDER — IOPAMIDOL (ISOVUE-370) INJECTION 76%
INTRAVENOUS | Status: DC | PRN
Start: 2016-03-13 — End: 2016-03-13
  Administered 2016-03-13: 60 mL via INTRA_ARTERIAL

## 2016-03-13 MED ORDER — HEPARIN SODIUM (PORCINE) 1000 UNIT/ML IJ SOLN
INTRAMUSCULAR | Status: DC | PRN
  Administered 2016-03-13: 4500 [IU] via INTRAVENOUS

## 2016-03-13 MED ORDER — SODIUM CHLORIDE 0.9% FLUSH: 3.0000 mL | Freq: Two times a day (BID) | INTRAVENOUS | Status: DC

## 2016-03-13 MED ORDER — HEPARIN (PORCINE) IN NACL 2-0.9 UNIT/ML-% IJ SOLN
INTRAMUSCULAR | Status: DC | PRN
  Administered 2016-03-13: 1500 mL

## 2016-03-13 MED ORDER — NICOTINE 21 MG/24HR TD PT24: 21.0000 mg | MEDICATED_PATCH | Freq: Every day | TRANSDERMAL | 0 refills | Status: DC

## 2016-03-13 MED ORDER — SODIUM CHLORIDE 0.9 % WEIGHT BASED INFUSION
1.0000 mL/kg/h | INTRAVENOUS | Status: AC
  Administered 2016-03-13: 1 mL/kg/h via INTRAVENOUS

## 2016-03-13 MED ORDER — VERAPAMIL HCL 2.5 MG/ML IV SOLN
INTRAVENOUS | Status: AC
  Filled 2016-03-13: qty 2

## 2016-03-13 MED ORDER — MIDAZOLAM HCL 2 MG/2ML IJ SOLN
INTRAMUSCULAR | Status: DC | PRN
  Administered 2016-03-13: 2 mg via INTRAVENOUS

## 2016-03-13 MED ORDER — FENTANYL CITRATE (PF) 100 MCG/2ML IJ SOLN
INTRAMUSCULAR | Status: AC
  Filled 2016-03-13: qty 2

## 2016-03-13 MED ORDER — SODIUM CHLORIDE 0.9% FLUSH: 3.0000 mL | INTRAVENOUS | Status: DC | PRN

## 2016-03-13 SURGICAL SUPPLY — 11 items

## 2016-03-13 NOTE — Progress Notes (Signed)
Patient seem and examined, vital stable, he is asymptomatic, awaiting for cardiac cath. Cardiology will take the patient on cardiology service, hospitalist sign off, case discussed with Cardiology Dr Elease HashimotoNahser.

## 2016-03-13 NOTE — Discharge Instructions (Signed)

## 2016-03-13 NOTE — Discharge Summary (Signed)
Discharge Summary    Patient ID: Jose Deleon,  MRN: [id number], DOB/AGE: 57-Jun-1962 57 y.o.  Admit date: 03/11/2016 Discharge date: 03/13/2016  Primary Care Provider: Minerva Ends Primary Cardiologist: Dr Acie Fredrickson  Discharge Diagnoses    Principal Problem:   Chest pain at rest Active Problems:   Diabetes mellitus without complication ([place])   Hypertension   Current smoker   Insomnia   STRONG FH: heart disease   Chest pain with high risk for cardiac etiology   Allergies No Known Allergies  Diagnostic Studies/Procedures    CARDIAC CATH: 08/24  The left ventricular systolic function is normal.  LV end diastolic pressure is mildly elevated.  The left ventricular ejection fraction is 50-55% by visual estimate.  1. Normal coronary anatomy 2. Good LV function 3. Mildly elevated LVEDP Plan: medical management.   Dg Chest 2 View Result Date: 03/11/2016 IMPRESSION: Right upper lobe airspace opacities with associated volume loss. Atelectasis is favored, though pneumonia would be difficult to exclude. Electronically Signed   By: Ulyses Jarred M.D.   On: 03/11/2016 13:39   Nm Myocar Multi W/spect W/wall Motion / Ef Result Date: 03/12/2016 IMPRESSION: 1. No stress-induced ischemia. Fixed defects in the inferior wall and apex are noted. 2. Global hypokinesis. 3. Left ventricular ejection fraction 45% 4. Non invasive risk stratification*: Intermediate risk *2012 Appropriate Use Criteria for Coronary Revascularization Focused Update: J Am Coll Cardiol. 8502;77(4):128-786. http://content.airportbarriers.com.aspx?articleid=[email or web address] Electronically Signed   By: Marybelle Killings M.D.   On: 03/12/2016 12:30   Ct Angio Chest Aorta W/cm &/or Wo/cm Result Date: 03/11/2016  IMPRESSION: Upper lobe predominant chronic interstitial lung changes, including thickening and architectural distortion of the interstitial markings, bronchial thickening and bronchiectasis and thick walled bulla  formation. These changes may be sequela of prior granulomatous disease, which is supported by patient has history of prior TB. Bronchoalveolar carcinoma is less favored, however is on the differential diagnosis, particularly in the medial aspect of the right upper lobe. No evidence of pulmonary embolus. Electronically Signed   By: Fidela Salisbury M.D.   On: 03/11/2016 19:24   _____________   History of Present Illness     55 y.o. year old male with a history of DM, GERD, HLD, HTN, TB was admitted 08/22 with intermittent chest pain.  Hospital Course     Consultants: IM   His cardiac enzymes were negative for MI. His CXR and CT chest were abnormal and are to be followed by primary MD.   Since his symptoms were atypical, ECG not acute, and enzymes were negative, a cardiology consult was called and a MV was performed. The MV was abnormal and cardiac cath was indicated.   The cath was performed on 08/24, results above. He had no significant CAD and his EF was normal. He was having some 2 sec pauses on telemetry that were secondary to non-conducted P waves. He is not on any rate-lowering medications and these should be avoided.   He was seen by the research team and enrolled in [place].   Post-cath, Jose Deleon was ambulating without chest pain or SOB. He is considered stable for discharge, to follow up as an outpatient.    _____________  Discharge Vitals Blood pressure 140/87, pulse 66, temperature 98 F (36.7 C), temperature source Oral, resp. rate 19, height _0  (1.905 m), weight 201 lb 9.6 oz (91.4 kg), SpO2 95 %.  Filed Weights   03/11/16 1911 03/12/16 0500 03/13/16 0428  Weight: 201 lb 8  oz (91.4 kg) 200 lb 14.4 oz (91.1 kg) 201 lb 9.6 oz (91.4 kg)    Labs & Radiologic Studies    CBC  Recent Labs  03/11/16 1245  WBC 9.0  HGB 14.8  HCT 45.9  MCV 93.9  PLT 536   Basic Metabolic Panel  Recent Labs  03/11/16 1245 03/13/16 0440  NA 136 138  K 4.2 3.8    CL 105 106  CO2 26 24  GLUCOSE 102* 98  BUN 12 13  CREATININE 1.05 0.93  CALCIUM 9.1 8.8*  MG  --  2.1   D-Dimer  Recent Labs  03/11/16 1954  DDIMER 0.74*   Hemoglobin A1C  Recent Labs  03/11/16 1954  HGBA1C 6.9*   Fasting Lipid Panel  Recent Labs  03/11/16 1954  CHOL 203*  HDL 62  LDLCALC 123*  TRIG 88  CHOLHDL 3.3   Thyroid Function Tests  Recent Labs  03/11/16 1954  TSH 1.752   _____________  Dg Chest 2 View  Result Date: 03/11/2016 CLINICAL DATA:  Left upper chest pain since 3 a.m. History of hypertension diabetes mellitus. EXAM: CHEST  2 VIEW COMPARISON:  None. FINDINGS: The lungs are well inflated. Cardiomediastinal contours are normal. No pneumothorax or sizable pleural effusion. There is right lung volume loss with right apical airspace opacity. The left lung is clear. No pulmonary edema. IMPRESSION: Right upper lobe airspace opacities with associated volume loss. Atelectasis is favored, though pneumonia would be difficult to exclude. Electronically Signed   By: Ulyses Jarred M.D.   On: 03/11/2016 13:39   Nm Myocar Multi W/spect W/wall Motion / Ef  Result Date: 03/12/2016 CLINICAL DATA:  Chest pain EXAM: MYOCARDIAL IMAGING WITH SPECT (REST AND PHARMACOLOGIC-STRESS) GATED LEFT VENTRICULAR WALL MOTION STUDY LEFT VENTRICULAR EJECTION FRACTION TECHNIQUE: Standard myocardial SPECT imaging was performed after resting intravenous injection of 10 mCi Tc-55mtetrofosmin. Subsequently, intravenous infusion of Lexiscan was performed under the supervision of the Cardiology staff. At peak effect of the drug, 30 mCi Tc-972metrofosmin was injected intravenously and standard myocardial SPECT imaging was performed. Quantitative gated imaging was also performed to evaluate left ventricular wall motion, and estimate left ventricular ejection fraction. COMPARISON:  None. FINDINGS: Perfusion: There is no evidence of stress-induced ischemia. There is a moderate-sized fixed  perfusion defect at the lateral apex. There is also a large fixed perfusion defect involving much of the inferior wall. Wall Motion: There is global hypokinesis. Left Ventricular Ejection Fraction: 45 % End diastolic volume 12144l End systolic volume 68 ml IMPRESSION: 1. No stress-induced ischemia. Fixed defects in the inferior wall and apex are noted. 2. Global hypokinesis. 3. Left ventricular ejection fraction 45% 4. Non invasive risk stratification*: Intermediate risk *2012 Appropriate Use Criteria for Coronary Revascularization Focused Update: J Am Coll Cardiol. 203154;00(8):676-195http://content.onairportbarriers.comspx?articleid=[email or web address] Electronically Signed   By: ArMarybelle Killings.D.   On: 03/12/2016 12:30   Ct Angio Chest Aorta W/cm &/or Wo/cm  Result Date: 03/11/2016 CLINICAL DATA:  Left-sided chest pain history of TB in 1991. EXAM: CT ANGIOGRAPHY CHEST WITH CONTRAST TECHNIQUE: Multidetector CT imaging of the chest was performed using the standard protocol during bolus administration of intravenous contrast. Multiplanar CT image reconstructions and MIPs were obtained to evaluate the vascular anatomy. CONTRAST:  100 cc Isovue 370 intravenously. COMPARISON:  Chest radiograph 03/11/2016 FINDINGS: Mediastinum/Lymph Nodes: No pulmonary emboli or thoracic aortic dissection identified. No masses or pathologically enlarged lymph nodes identified. The heart is normal in size. There is no pericardial effusion. Lungs/Pleura: There  are upper lobe predominant chronic interstitial lung changes with traction bronchiectasis and thick-walled bulla formation, heavily affecting the right upper lobe. No evidence of acute airspace consolidation. There is biapical subpleural thickening which may also represent scarring. Upper abdomen: No acute findings. Musculoskeletal: No chest wall mass or suspicious bone lesions identified. Review of the MIP images confirms the above findings. IMPRESSION: Upper lobe predominant chronic  interstitial lung changes, including thickening and architectural distortion of the interstitial markings, bronchial thickening and bronchiectasis and thick walled bulla formation. These changes may be sequela of prior granulomatous disease, which is supported by patient has history of prior TB. Bronchoalveolar carcinoma is less favored, however is on the differential diagnosis, particularly in the medial aspect of the right upper lobe. No evidence of pulmonary embolus. Electronically Signed   By: Fidela Salisbury M.D.   On: 03/11/2016 19:24   Disposition   Pt is being discharged home today in good condition.  Follow-up Plans & Appointments    Follow-up Information    Minerva Ends, MD .   Specialty:  Family Medicine Why:  Follow up abnormal chest Xray and CT scan, may be related to prior TB. Contact information: [place] [address] [phone]        Mertie Moores, MD .   Specialty:  Cardiology Why:  As needed. Contact information: [place] [address] [place] [address] [phone]            Discharge Medications   Current Discharge Medication List    START taking these medications   Details  nicotine (NICODERM CQ - DOSED IN MG/24 HOURS) 21 mg/24hr patch Place 1 patch (21 mg total) onto the skin daily. Qty: 14 patch, Refills: 0      CONTINUE these medications which have NOT CHANGED   Details  aspirin 81 MG tablet 81 mg daily.   Associated Diagnoses: Type 2 diabetes mellitus without complication, without long-term current use of insulin (HCC)    Cinnamon 500 MG TABS Take by mouth.   Associated Diagnoses: Essential hypertension    lisinopril (PRINIVIL,ZESTRIL) 40 MG tablet Take 1 tablet (40 mg total) by mouth daily. Qty: 30 tablet, Refills: 3   Associated Diagnoses: Diabetes mellitus without complication ([place]); Essential hypertension    metFORMIN (GLUCOPHAGE) 850 MG tablet Take 1 tablet (850 mg total) by mouth daily  with breakfast. Qty: 90 tablet, Refills: 3   Associated Diagnoses: Diabetes mellitus without complication (H[facility])    sildenafil (VIAGRA) 100 MG tablet Take 0.5-1 tablets (50-100 mg total) by mouth daily as needed for erectile dysfunction. Qty: 5 tablet, Refills: 11   Associated Diagnoses: Drug-induced erectile dysfunction    atorvastatin (LIPITOR) 40 MG tablet Take 1 tablet (40 mg total) by mouth daily. Qty: 90 tablet, Refills: 3   Associated Diagnoses: Type 2 diabetes mellitus without complication, without long-term current use of insulin ([place]); Hyperlipidemia associated with type 2 diabetes mellitus (HCC)    Blood Glucose Monitoring Suppl (TRUERESULT BLOOD GLUCOSE) W/DEVICE KIT 1 each by Does not apply route daily. Qty: 1 each, Refills: 0   Associated Diagnoses: Diabetes mellitus without complication (H[facility])    diphenhydrAMINE (BENADRYL) 25 MG tablet Take 1-2 tablets (25-50 mg total) by mouth every 6 (six) hours as needed for sleep. Qty: 30 tablet, Refills: 0   Associated Diagnoses: Insomnia    glucose blood (TRUETEST TEST) test strip 1 each by Other route 2 (two) times daily. Use as instructed Qty: 100 each, Refills: 12   Associated  Diagnoses: Diabetes mellitus without complication (H[facility])    terbinafine (LAMISIL AT) 1 % cream Apply 1 application topically 2 (two) times daily. Qty: 30 g, Refills: 0   Associated Diagnoses: Tinea pedis of right foot    TRUEPLUS LANCETS 28G MISC 1 each by Does not apply route 3 (three) times daily. Qty: 100 each, Refills: 12   Associated Diagnoses: Diabetes mellitus without complication ([place])          Outstanding Labs/Studies   None  Duration of Discharge Encounter   Greater than 30 minutes including physician time.  Jonetta Speak NP 03/13/2016, 5:27 PM  Attending Note:   The patient was seen and examined.  Agree with assessment and plan as noted above.  Changes made to the above note as needed.  Patient seen and independently  examined with Rosaria Ferries, PA .   We discussed all aspects of the encounter. I agree with the assessment and plan as stated above.  Pt had an intermediate risk myoview and and echo that in some views appeared to have segmental wall motion abn. Cath today showed no CAD He is stable for DC  He will follow up with [place] as needed.     I have spent a total of 40 minutes with patient reviewing hospital  notes , telemetry, EKGs, labs and examining patient as well as establishing an assessment and plan that was discussed with the patient. > 50% of time was spent in direct patient care.    Thayer Headings, Brooke Bonito., MD, [facility] 03/13/2016, 5:53 PM [address]  [place] Pager [phone]

## 2016-03-13 NOTE — H&P (View-Only) (Signed)
PROGRESS NOTE  Subjective:   55 y.o. year old male with a history of DM, GERD, HLD, HTN, TB.  myoview revealed several defects - A fixed defect in the lateral apical wall and a large fixed inferior defect His echo also showed mild hypokinesis of the distal ant wall  Going for cath today   Objective:    Vital Signs:   Temp:  [97.9 F (36.6 C)-98.6 F (37 C)] 97.9 F (36.6 C) (08/24 0428) Pulse Rate:  [58-93] 60 (08/24 0428) Resp:  [16-18] 16 (08/24 0428) BP: (132-163)/(82-96) 138/83 (08/24 0428) SpO2:  [94 %-95 %] 95 % (08/24 0428) Weight:  [201 lb 9.6 oz (91.4 kg)] 201 lb 9.6 oz (91.4 kg) (08/24 0428)  Last BM Date: 03/11/16   24-hour weight change: Weight change: -3 lb 6.4 oz (-1.542 kg)  Weight trends: Filed Weights   03/11/16 1911 03/12/16 0500 03/13/16 0428  Weight: 201 lb 8 oz (91.4 kg) 200 lb 14.4 oz (91.1 kg) 201 lb 9.6 oz (91.4 kg)    Intake/Output:  08/23 0701 - 08/24 0700 In: 840 [P.O.:840] Out: -  Total I/O In: 273 [I.V.:273] Out: -    Physical Exam: BP 138/83 (BP Location: Left Arm)   Pulse 60   Temp 97.9 F (36.6 C) (Oral)   Resp 16   Ht 6\' 3"  (1.905 m)   Wt 201 lb 9.6 oz (91.4 kg)   SpO2 95%   BMI 25.20 kg/m   Wt Readings from Last 3 Encounters:  03/13/16 201 lb 9.6 oz (91.4 kg)  10/08/15 210 lb (95.3 kg)  09/08/14 207 lb (93.9 kg)    General: Vital signs reviewed and noted.   Head: Normocephalic, atraumatic.  Eyes: conjunctivae/corneas clear.  EOM's intact.   Throat: normal  Neck:  normal   Lungs:    clear   Heart:  RR   Abdomen:  Soft, non-tender, non-distended    Extremities: Good pulses   Neurologic: A&O X3, CN II - XII are grossly intact.   Psych: Normal     Labs: BMET:  Recent Labs  03/11/16 1245 03/13/16 0440  NA 136 138  K 4.2 3.8  CL 105 106  CO2 26 24  GLUCOSE 102* 98  BUN 12 13  CREATININE 1.05 0.93  CALCIUM 9.1 8.8*  MG  --  2.1    Liver function tests: No results for input(s): AST, ALT,  ALKPHOS, BILITOT, PROT, ALBUMIN in the last 72 hours. No results for input(s): LIPASE, AMYLASE in the last 72 hours.  CBC:  Recent Labs  03/11/16 1245  WBC 9.0  HGB 14.8  HCT 45.9  MCV 93.9  PLT 249    Cardiac Enzymes: No results for input(s): CKTOTAL, CKMB, TROPONINI in the last 72 hours.  Coagulation Studies:  Recent Labs  03/13/16 0440  LABPROT 12.8  INR 0.97    Other: Invalid input(s): POCBNP  Recent Labs  03/11/16 1954  DDIMER 0.74*    Recent Labs  03/11/16 1954  HGBA1C 6.9*    Recent Labs  03/11/16 1954  CHOL 203*  HDL 62  LDLCALC 123*  TRIG 88  CHOLHDL 3.3    Recent Labs  03/11/16 1954  TSH 1.752   No results for input(s): VITAMINB12, FOLATE, FERRITIN, TIBC, IRON, RETICCTPCT in the last 72 hours.   Other results:    ( personally reviewed )  -NSR   Medications:    Infusions: . sodium chloride 1 mL/kg/hr (03/13/16 0708)  Scheduled Medications: . aspirin  81 mg Oral Daily  . enoxaparin (LOVENOX) injection  40 mg Subcutaneous Q24H  . insulin aspart  0-9 Units Subcutaneous TID WC  . lisinopril  40 mg Oral Daily  . nicotine  21 mg Transdermal Daily  . pneumococcal 23 valent vaccine  0.5 mL Intramuscular Tomorrow-1000  . sodium chloride flush  3 mL Intravenous Q12H    Assessment/ Plan:   Principal Problem:   Chest pain at rest Active Problems:   Diabetes mellitus without complication (HCC)   Hypertension   Current smoker   Insomnia   STRONG FH: heart disease   Chest pain with high risk for cardiac etiology  1. Chest pain  Has risk factors for CAD Intermediate myoview with mildly reduced EF. Going for cath today      Disposition:  Length of Stay: 0  Vesta MixerPhilip J. Tyrah Broers, Montez HagemanJr., MD, [facility] Hospital[facility]ACC 03/13/2016, 9:22 AM Office [phone] Pager [phone]

## 2016-03-13 NOTE — Plan of Care (Signed)
Problem: Education: Goal: Knowledge of [facility] Education information/materials will improve Outcome: Progressing Patient aware of plan of care.  Patient has denied pain thus far this shift.  RN provided medication education pertaining to Lovenox prior to administration.  Patient stated understanding.  RN spoke with Armanda Magicraci Turner on call for cardiology earlier this shift.  Per Armanda Magicraci Turner ok to administer yesterday evening dose of Lovenox as ordered.

## 2016-03-13 NOTE — Plan of Care (Signed)
Problem: Education: Goal: Knowledge of [facility] Education information/materials will improve Outcome: Completed/Met Date Met: 03/13/16 Pt educated throughout entire hospitalization regarding tests, procedures, medications, and available resources.   Problem: Safety: Goal: Ability to remain free from injury will improve Outcome: Completed/Met Date Met: 03/13/16 Pt has remained free from injury during this admission   Problem: Physical Regulation: Goal: Will remain free from infection Outcome: Completed/Met Date Met: 03/13/16 Pt has remained free from infection   Problem: Fluid Volume: Goal: Ability to maintain a balanced intake and output will improve Outcome: Completed/Met Date Met: 03/13/16 Pt has adequate intake and output   Problem: Consults Goal: Cardiac Cath Patient Education (See Patient Education module for education specifics.)  Outcome: Completed/Met Date Met: 03/13/16 Pt educated regarding cardiac cath   Problem: Phase I Progression Outcomes Goal: Pain controlled with appropriate interventions Outcome: Completed/Met Date Met: 03/13/16 Pt has been chest pain free

## 2016-03-13 NOTE — Research (Signed)
[place] Study Informed Consent   Subject Name: Jose Deleon  Subject met inclusion and exclusion criteria.  The informed consent form, study requirements and expectations were reviewed with the subject and questions and concerns were addressed prior to the signing of the consent form.  The subject verbalized understanding of the trial requirements.  The subject agreed to participate in the trial and signed the informed consent at [facility] on 03/13/2016.  The informed consent was obtained prior to performance of any protocol-specific procedures for the subject.  A copy of the signed informed consent was given to the subject and a copy was placed in the subject's medical record.  Blossom Hoops 03/13/2016, 3:36 PM

## 2016-03-13 NOTE — Progress Notes (Signed)
    PROGRESS NOTE  Subjective:   55 y.o. year old male with a history of DM, GERD, HLD, HTN, TB.  myoview revealed several defects - A fixed defect in the lateral apical wall and a large fixed inferior defect His echo also showed mild hypokinesis of the distal ant wall  Going for cath today   Objective:    Vital Signs:   Temp:  [97.9 F (36.6 C)-98.6 F (37 C)] 97.9 F (36.6 C) (08/24 0428) Pulse Rate:  [58-93] 60 (08/24 0428) Resp:  [16-18] 16 (08/24 0428) BP: (132-163)/(82-96) 138/83 (08/24 0428) SpO2:  [94 %-95 %] 95 % (08/24 0428) Weight:  [201 lb 9.6 oz (91.4 kg)] 201 lb 9.6 oz (91.4 kg) (08/24 0428)  Last BM Date: 03/11/16   24-hour weight change: Weight change: -3 lb 6.4 oz (-1.542 kg)  Weight trends: Filed Weights   03/11/16 1911 03/12/16 0500 03/13/16 0428  Weight: 201 lb 8 oz (91.4 kg) 200 lb 14.4 oz (91.1 kg) 201 lb 9.6 oz (91.4 kg)    Intake/Output:  08/23 0701 - 08/24 0700 In: 840 [P.O.:840] Out: -  Total I/O In: 273 [I.V.:273] Out: -    Physical Exam: BP 138/83 (BP Location: Left Arm)   Pulse 60   Temp 97.9 F (36.6 C) (Oral)   Resp 16   Ht 6' 3" (1.905 m)   Wt 201 lb 9.6 oz (91.4 kg)   SpO2 95%   BMI 25.20 kg/m   Wt Readings from Last 3 Encounters:  03/13/16 201 lb 9.6 oz (91.4 kg)  10/08/15 210 lb (95.3 kg)  09/08/14 207 lb (93.9 kg)    General: Vital signs reviewed and noted.   Head: Normocephalic, atraumatic.  Eyes: conjunctivae/corneas clear.  EOM's intact.   Throat: normal  Neck:  normal   Lungs:    clear   Heart:  RR   Abdomen:  Soft, non-tender, non-distended    Extremities: Good pulses   Neurologic: A&O X3, CN II - XII are grossly intact.   Psych: Normal     Labs: BMET:  Recent Labs  03/11/16 1245 03/13/16 0440  NA 136 138  K 4.2 3.8  CL 105 106  CO2 26 24  GLUCOSE 102* 98  BUN 12 13  CREATININE 1.05 0.93  CALCIUM 9.1 8.8*  MG  --  2.1    Liver function tests: No results for input(s): AST, ALT,  ALKPHOS, BILITOT, PROT, ALBUMIN in the last 72 hours. No results for input(s): LIPASE, AMYLASE in the last 72 hours.  CBC:  Recent Labs  03/11/16 1245  WBC 9.0  HGB 14.8  HCT 45.9  MCV 93.9  PLT 249    Cardiac Enzymes: No results for input(s): CKTOTAL, CKMB, TROPONINI in the last 72 hours.  Coagulation Studies:  Recent Labs  03/13/16 0440  LABPROT 12.8  INR 0.97    Other: Invalid input(s): POCBNP  Recent Labs  03/11/16 1954  DDIMER 0.74*    Recent Labs  03/11/16 1954  HGBA1C 6.9*    Recent Labs  03/11/16 1954  CHOL 203*  HDL 62  LDLCALC 123*  TRIG 88  CHOLHDL 3.3    Recent Labs  03/11/16 1954  TSH 1.752   No results for input(s): VITAMINB12, FOLATE, FERRITIN, TIBC, IRON, RETICCTPCT in the last 72 hours.   Other results:    ( personally reviewed )  -NSR   Medications:    Infusions: . sodium chloride 1 mL/kg/hr (03/13/16 0708)      Scheduled Medications: . aspirin  81 mg Oral Daily  . enoxaparin (LOVENOX) injection  40 mg Subcutaneous Q24H  . insulin aspart  0-9 Units Subcutaneous TID WC  . lisinopril  40 mg Oral Daily  . nicotine  21 mg Transdermal Daily  . pneumococcal 23 valent vaccine  0.5 mL Intramuscular Tomorrow-1000  . sodium chloride flush  3 mL Intravenous Q12H    Assessment/ Plan:   Principal Problem:   Chest pain at rest Active Problems:   Diabetes mellitus without complication (HCC)   Hypertension   Current smoker   Insomnia   STRONG FH: heart disease   Chest pain with high risk for cardiac etiology  1. Chest pain  Has risk factors for CAD Intermediate myoview with mildly reduced EF. Going for cath today      Disposition:  Length of Stay: 0  Vesta MixerPhilip J. Caswell Alvillar, Montez HagemanJr., MD, [facility] 03/13/2016, 9:22 AM Office [phone] Pager [phone]

## 2016-03-13 NOTE — Progress Notes (Signed)
Pt discharged home with girlfriend.  Reviewed discharge instructions and education, all questions answered.  Assessment unchanged from earlier. Right radial site a level 0 with no chest pain

## 2016-03-13 NOTE — Interval H&P Note (Signed)
History and Physical Interval Note:  03/13/2016 1:24 PM  Jose Deleon  has presented today for surgery, with the diagnosis of cp - abnormal nuc - abnormanl echo  The various methods of treatment have been discussed with the patient and family. After consideration of risks, benefits and other options for treatment, the patient has consented to  Procedure(s): Left Heart Cath and Coronary Angiography (N/A) as a surgical intervention .  The patient's history has been reviewed, patient examined, no change in status, stable for surgery.  I have reviewed the patient's chart and labs.  Questions were answered to the patient's satisfaction.   Cath Lab Visit (complete for each Cath Lab visit)  Clinical Evaluation Leading to the Procedure:   ACS: Yes.    Non-ACS:    Anginal Classification: CCS III  Anti-ischemic medical therapy: No Therapy  Non-Invasive Test Results: Intermediate-risk stress test findings: cardiac mortality 1-3%/year  Prior CABG: No previous CABG        Theron AristaPeter Bay[facility] Center - College StationJordanMD,FACC 03/13/2016 1:24 PM

## 2016-03-13 NOTE — Plan of Care (Signed)
Problem: Phase III Progression Outcomes Goal: Vascular site scale level 0 - I Vascular Site Scale Level 0: No bruising/bleeding/hematoma Level I (Mild): Bruising/Ecchymosis, minimal bleeding/ooozing, palpable hematoma < 3 cm Level II (Moderate): Bleeding not affecting hemodynamic parameters, pseudoaneurysm, palpable hematoma > 3 cm Level III  (Severe) Bleeding which affects hemodynamic parameters or retroperitoneal hemorrhage   Outcome: Completed/Met Date Met: 03/13/16 Right radial level 0 Goal: Discharge plan remains appropriate-arrangements made Outcome: Completed/Met Date Met: 03/13/16 Pt being discharged home today   Problem: Discharge Progression Outcomes Goal: Vascular site scale level 0 - I Vascular Site Scale Level 0: No bruising/bleeding/hematoma Level I (Mild): Bruising/Ecchymosis, minimal bleeding/ooozing, palpable hematoma < 3 cm Level II (Moderate): Bleeding not affecting hemodynamic parameters, pseudoaneurysm, palpable hematoma > 3 cm Level III  (Severe) Bleeding which affects hemodynamic parameters or retroperitoneal hemorrhage   Outcome: Completed/Met Date Met: 03/13/16 Right radial level 0

## 2016-03-13 NOTE — Plan of Care (Signed)
Problem: Consults Goal: Chest Pain Patient Education (See Patient Education module for education specifics.) Outcome: Completed/Met Date Met: 03/13/16 Pt educated on chest pain   Problem: Phase I Progression Outcomes Goal: Anginal pain relieved Outcome: Completed/Met Date Met: 03/13/16 Pt remains free from chest pain   Problem: Phase II Progression Outcomes Goal: Anginal pain relieved Outcome: Completed/Met Date Met: 03/13/16 Pt remains chest pain free Goal: Stress Test if indicated Outcome: Completed/Met Date Met: 03/13/16 Pt went for stress test   Problem: Phase III Progression Outcomes Goal: No anginal pain Outcome: Completed/Met Date Met: 03/13/16 Pt remains free from chest pain  Goal: Tolerating diet Outcome: Completed/Met Date Met: 03/13/16 Pt able to tolerate current diet   Problem: Discharge Progression Outcomes Goal: No anginal pain Outcome: Completed/Met Date Met: 03/13/16 Pt remains chest pain free Goal: Tolerates diet Outcome: Completed/Met Date Met: 03/13/16 Pt able to tolerate current diet with no difficulties

## 2016-03-31 ENCOUNTER — Other Ambulatory Visit: Payer: Self-pay | Admitting: Family Medicine

## 2016-03-31 DIAGNOSIS — E119 Type 2 diabetes mellitus without complications: Secondary | ICD-10-CM

## 2016-03-31 DIAGNOSIS — I10 Essential (primary) hypertension: Secondary | ICD-10-CM

## 2016-06-09 ENCOUNTER — Telehealth: Payer: Self-pay | Admitting: Family Medicine

## 2016-06-09 NOTE — Telephone Encounter (Signed)
Pt came in stating that when his Lisinopril was refilled it was filled for a 40mg  tablet, instructed to take one a day. Pt has been taking 40mg  since last refilled on 03/31/2016  Pt states he was previously taking 10 mg of Lisinopril, instructed to take one a day, and wants to know why the dosage was changed and what effect this could be having on him  Pt is extremely concerned and is requesting to speak directly to PCP

## 2016-06-09 NOTE — Telephone Encounter (Signed)
Called patient Verified name and  DOB  patient that his BP was high at his last OV in 09/2015 so his lisinopril was increased from 10 to 40 mg daily  He is advised to take 40 mg daily. He agrees to this Will monitor his BP He has f/u appt with me on 06/23/16

## 2016-06-23 ENCOUNTER — Encounter: Payer: Self-pay | Admitting: Family Medicine

## 2016-06-23 ENCOUNTER — Ambulatory Visit: Payer: Self-pay | Attending: Family Medicine | Admitting: Family Medicine

## 2016-06-23 VITALS — BP 130/78 | HR 64 | Temp 98.7°F | Ht 75.0 in | Wt 203.0 lb

## 2016-06-23 DIAGNOSIS — E1169 Type 2 diabetes mellitus with other specified complication: Secondary | ICD-10-CM

## 2016-06-23 DIAGNOSIS — N529 Male erectile dysfunction, unspecified: Secondary | ICD-10-CM | POA: Insufficient documentation

## 2016-06-23 DIAGNOSIS — E119 Type 2 diabetes mellitus without complications: Secondary | ICD-10-CM

## 2016-06-23 DIAGNOSIS — Z79899 Other long term (current) drug therapy: Secondary | ICD-10-CM | POA: Insufficient documentation

## 2016-06-23 DIAGNOSIS — I10 Essential (primary) hypertension: Secondary | ICD-10-CM

## 2016-06-23 DIAGNOSIS — Z7984 Long term (current) use of oral hypoglycemic drugs: Secondary | ICD-10-CM | POA: Insufficient documentation

## 2016-06-23 DIAGNOSIS — R197 Diarrhea, unspecified: Secondary | ICD-10-CM | POA: Insufficient documentation

## 2016-06-23 DIAGNOSIS — Z87891 Personal history of nicotine dependence: Secondary | ICD-10-CM | POA: Insufficient documentation

## 2016-06-23 DIAGNOSIS — E785 Hyperlipidemia, unspecified: Secondary | ICD-10-CM

## 2016-06-23 DIAGNOSIS — Z7982 Long term (current) use of aspirin: Secondary | ICD-10-CM | POA: Insufficient documentation

## 2016-06-23 LAB — GLUCOSE, POCT (MANUAL RESULT ENTRY): POC Glucose: 95 mg/dl (ref 70–99)

## 2016-06-23 MED ORDER — METFORMIN HCL 850 MG PO TABS: 850.0000 mg | ORAL_TABLET | Freq: Every day | ORAL | 3 refills | Status: DC

## 2016-06-23 MED ORDER — ATORVASTATIN CALCIUM 40 MG PO TABS: 40.0000 mg | ORAL_TABLET | Freq: Every day | ORAL | 3 refills | Status: DC

## 2016-06-23 MED ORDER — LISINOPRIL 40 MG PO TABS: 40.0000 mg | ORAL_TABLET | Freq: Every day | ORAL | 3 refills | Status: DC

## 2016-06-23 NOTE — Patient Instructions (Addendum)
Jose Deleon was seen today for hypertension.  Diagnoses and all orders for this visit:  Diabetes mellitus without complication (HCC) -     POCT glucose (manual entry) -     Ambulatory referral to Ophthalmology  Essential hypertension  Type 2 diabetes mellitus without complication, without long-term current use of insulin (HCC) -     atorvastatin (LIPITOR) 40 MG tablet; Take 1 tablet (40 mg total) by mouth daily.  Hyperlipidemia associated with type 2 diabetes mellitus (HCC) -     atorvastatin (LIPITOR) 40 MG tablet; Take 1 tablet (40 mg total) by mouth daily.  If BP  above goal with lisinopril 40 mg  Will change to prinzide 20-12.5 mg daily   F/u in 3 months for diabetes and HTN   Dr. Armen PickupFunches

## 2016-06-23 NOTE — Progress Notes (Signed)
Subjective:  Patient ID: Jose Deleon, male    DOB: [birth year]  Age: 55 y.o. MRN: [id number]  CC: Hypertension   HPI [name] presents for    1. CHRONIC HYPERTENSION  Disease Monitoring  Blood pressure range: checking once weekly. Has elevated diastolic BP at home   Chest pain: no   Dyspnea: no   Claudication: no   Medication compliance: yes  Medication Side Effects  Lightheadedness: no   Urinary frequency: no   Edema: no   Impotence: yes   2. Diabetes: taking metformin every AM. Having GI upset today following eating pizza last night, diarrhea. Usually does not have GI upset. Some blurry distant vision. No tingling or numbness in hands in feet.   Social History  Substance Use Topics  . Smoking status: Former Smoker    Packs/day: 0.33    Years: 30.00    Types: Cigarettes    Quit date: 03/07/2016  . Smokeless tobacco: Never Used  . Alcohol use 2.4 oz/week    4 Cans of beer per week    Outpatient Medications Prior to Visit  Medication Sig Dispense Refill  . aspirin 81 MG tablet 81 mg daily.    Marland Kitchen atorvastatin (LIPITOR) 40 MG tablet Take 1 tablet (40 mg total) by mouth daily. (Patient not taking: Reported on 03/11/2016) 90 tablet 3  . Blood Glucose Monitoring Suppl (TRUERESULT BLOOD GLUCOSE) W/DEVICE KIT 1 each by Does not apply route daily. 1 each 0  . Cinnamon 500 MG TABS Take by mouth.    . diphenhydrAMINE (BENADRYL) 25 MG tablet Take 1-2 tablets (25-50 mg total) by mouth every 6 (six) hours as needed for sleep. (Patient not taking: Reported on 03/11/2016) 30 tablet 0  . glucose blood (TRUETEST TEST) test strip 1 each by Other route 2 (two) times daily. Use as instructed 100 each 12  . lisinopril (PRINIVIL,ZESTRIL) 40 MG tablet TAKE ONE TABLET BY MOUTH EVERY DAY 30 tablet 0  . metFORMIN (GLUCOPHAGE) 850 MG tablet Take 1 tablet (850 mg total) by mouth daily with breakfast. 90 tablet 3  . nicotine (NICODERM CQ - DOSED IN MG/24 HOURS) 21 mg/24hr patch Place 1 patch  (21 mg total) onto the skin daily. 14 patch 0  . sildenafil (VIAGRA) 100 MG tablet Take 0.5-1 tablets (50-100 mg total) by mouth daily as needed for erectile dysfunction. 5 tablet 11  . terbinafine (LAMISIL AT) 1 % cream Apply 1 application topically 2 (two) times daily. (Patient not taking: Reported on 03/11/2016) 30 g 0  . TRUEPLUS LANCETS 28G MISC 1 each by Does not apply route 3 (three) times daily. (Patient not taking: Reported on 03/11/2016) 100 each 12   No facility-administered medications prior to visit.     ROS Review of Systems  Constitutional: Negative for chills, fatigue, fever and unexpected weight change.  Eyes: Negative for visual disturbance.  Respiratory: Negative for cough and shortness of breath.   Cardiovascular: Negative for chest pain, palpitations and leg swelling.  Gastrointestinal: Negative for abdominal pain, blood in stool, constipation, diarrhea, nausea and vomiting.  Endocrine: Negative for polydipsia, polyphagia and polyuria.  Musculoskeletal: Negative for arthralgias, back pain, gait problem, myalgias and neck pain.  Skin: Negative for rash.  Allergic/Immunologic: Negative for immunocompromised state.  Hematological: Negative for adenopathy. Does not bruise/bleed easily.  Psychiatric/Behavioral: Negative for dysphoric mood, sleep disturbance and suicidal ideas. The patient is not nervous/anxious.     Objective:  BP (!) 143/78 (BP Location: Left Arm, Patient Position: Sitting, Cuff Size:  Small)   Pulse 64   Temp 98.7 F (37.1 C) (Oral)   Ht _0  (1.905 m)   Wt 203 lb (92.1 kg)   SpO2 97%   BMI 25.37 kg/m   BP/Weight 06/23/2016 03/13/2016 04/05/9449  Systolic BP 388 828 003  Diastolic BP 78 77 91  Wt. (Lbs) 203 201.6 210  BMI 25.37 25.2 26.25    Physical Exam  Constitutional: He appears well-developed and well-nourished. No distress.  HENT:  Head: Normocephalic and atraumatic.  Neck: Normal range of motion. Neck supple.  Cardiovascular: Normal  rate, regular rhythm, normal heart sounds and intact distal pulses.   Pulmonary/Chest: Effort normal and breath sounds normal.  Musculoskeletal: He exhibits no edema.  Neurological: He is alert.  Skin: Skin is warm and dry. No rash noted. No erythema.  Psychiatric: He has a normal mood and affect.    Lab Results  Component Value Date   HGBA1C 6.9 (H) 03/11/2016   CBG 95 Assessment & Plan:   Mieczyslaw was seen today for hypertension.  Diagnoses and all orders for this visit:  Diabetes mellitus without complication ([facility]) -     POCT glucose (manual entry) -     Ambulatory referral to Ophthalmology -     metFORMIN (GLUCOPHAGE) 850 MG tablet; Take 1 tablet (850 mg total) by mouth daily with breakfast.  Essential hypertension -     lisinopril (PRINIVIL,ZESTRIL) 40 MG tablet; Take 1 tablet (40 mg total) by mouth daily.  Type 2 diabetes mellitus without complication, without long-term current use of insulin (HCC) -     atorvastatin (LIPITOR) 40 MG tablet; Take 1 tablet (40 mg total) by mouth daily.  Hyperlipidemia associated with type 2 diabetes mellitus (HCC) -     atorvastatin (LIPITOR) 40 MG tablet; Take 1 tablet (40 mg total) by mouth daily.    No orders of the defined types were placed in this encounter.   Follow-up: Return in about 3 months (around 09/21/2016) for HTN and DM2.   Boykin Nearing MD

## 2016-10-29 ENCOUNTER — Encounter: Payer: Self-pay | Admitting: Family Medicine

## 2016-10-29 ENCOUNTER — Other Ambulatory Visit: Payer: Self-pay | Admitting: Family Medicine

## 2016-10-29 DIAGNOSIS — N529 Male erectile dysfunction, unspecified: Secondary | ICD-10-CM

## 2016-10-29 DIAGNOSIS — E119 Type 2 diabetes mellitus without complications: Secondary | ICD-10-CM

## 2016-10-29 MED ORDER — TRUERESULT BLOOD GLUCOSE W/DEVICE KIT: 1.0000 | PACK | Freq: Every day | 0 refills | Status: DC

## 2016-10-29 NOTE — Telephone Encounter (Signed)
Patient request

## 2016-11-09 MED ORDER — SILDENAFIL CITRATE 100 MG PO TABS: 50.0000 mg | ORAL_TABLET | Freq: Every day | ORAL | 11 refills | Status: DC | PRN

## 2016-12-03 ENCOUNTER — Encounter: Payer: Self-pay | Admitting: Family Medicine

## 2016-12-24 ENCOUNTER — Other Ambulatory Visit: Payer: Self-pay | Admitting: Family Medicine

## 2016-12-24 ENCOUNTER — Encounter: Payer: Self-pay | Admitting: Family Medicine

## 2016-12-24 DIAGNOSIS — E119 Type 2 diabetes mellitus without complications: Secondary | ICD-10-CM

## 2016-12-24 DIAGNOSIS — I10 Essential (primary) hypertension: Secondary | ICD-10-CM

## 2016-12-24 DIAGNOSIS — N529 Male erectile dysfunction, unspecified: Secondary | ICD-10-CM

## 2016-12-24 DIAGNOSIS — E1169 Type 2 diabetes mellitus with other specified complication: Secondary | ICD-10-CM

## 2016-12-24 DIAGNOSIS — E785 Hyperlipidemia, unspecified: Secondary | ICD-10-CM

## 2017-01-01 MED ORDER — TRUERESULT BLOOD GLUCOSE W/DEVICE KIT: 1.0000 | PACK | Freq: Every day | 0 refills | Status: DC

## 2017-02-10 MED ORDER — ATORVASTATIN CALCIUM 40 MG PO TABS: 40.0000 mg | ORAL_TABLET | Freq: Every day | ORAL | 3 refills | Status: DC

## 2017-02-10 MED ORDER — SILDENAFIL CITRATE 20 MG PO TABS: 80.0000 mg | ORAL_TABLET | Freq: Every day | ORAL | 5 refills | Status: DC | PRN

## 2017-02-10 MED ORDER — SILDENAFIL CITRATE 20 MG PO TABS: 80.0000 mg | ORAL_TABLET | Freq: Every day | ORAL | 0 refills | Status: DC | PRN

## 2017-02-10 MED ORDER — METFORMIN HCL 850 MG PO TABS
850.0000 mg | ORAL_TABLET | Freq: Every day | ORAL | 3 refills | Status: DC
Start: 2017-02-10 — End: 2018-03-16

## 2017-02-10 MED ORDER — LISINOPRIL 40 MG PO TABS: 40.0000 mg | ORAL_TABLET | Freq: Every day | ORAL | 3 refills | Status: DC

## 2017-02-10 MED ORDER — GLUCOSE BLOOD VI STRP: 1.0000 | ORAL_STRIP | Freq: Two times a day (BID) | 3 refills | Status: DC

## 2017-02-10 NOTE — Telephone Encounter (Signed)
Patient response

## 2017-07-09 IMAGING — DX DG CHEST 2V
2 series · 2 of 2 positions shown · non-contrast
Comparison: None.

CLINICAL DATA: Left upper chest pain since 3 a.m.. History of
hypertension diabetes mellitus.

EXAM:
CHEST  2 VIEW

[w chest pa]
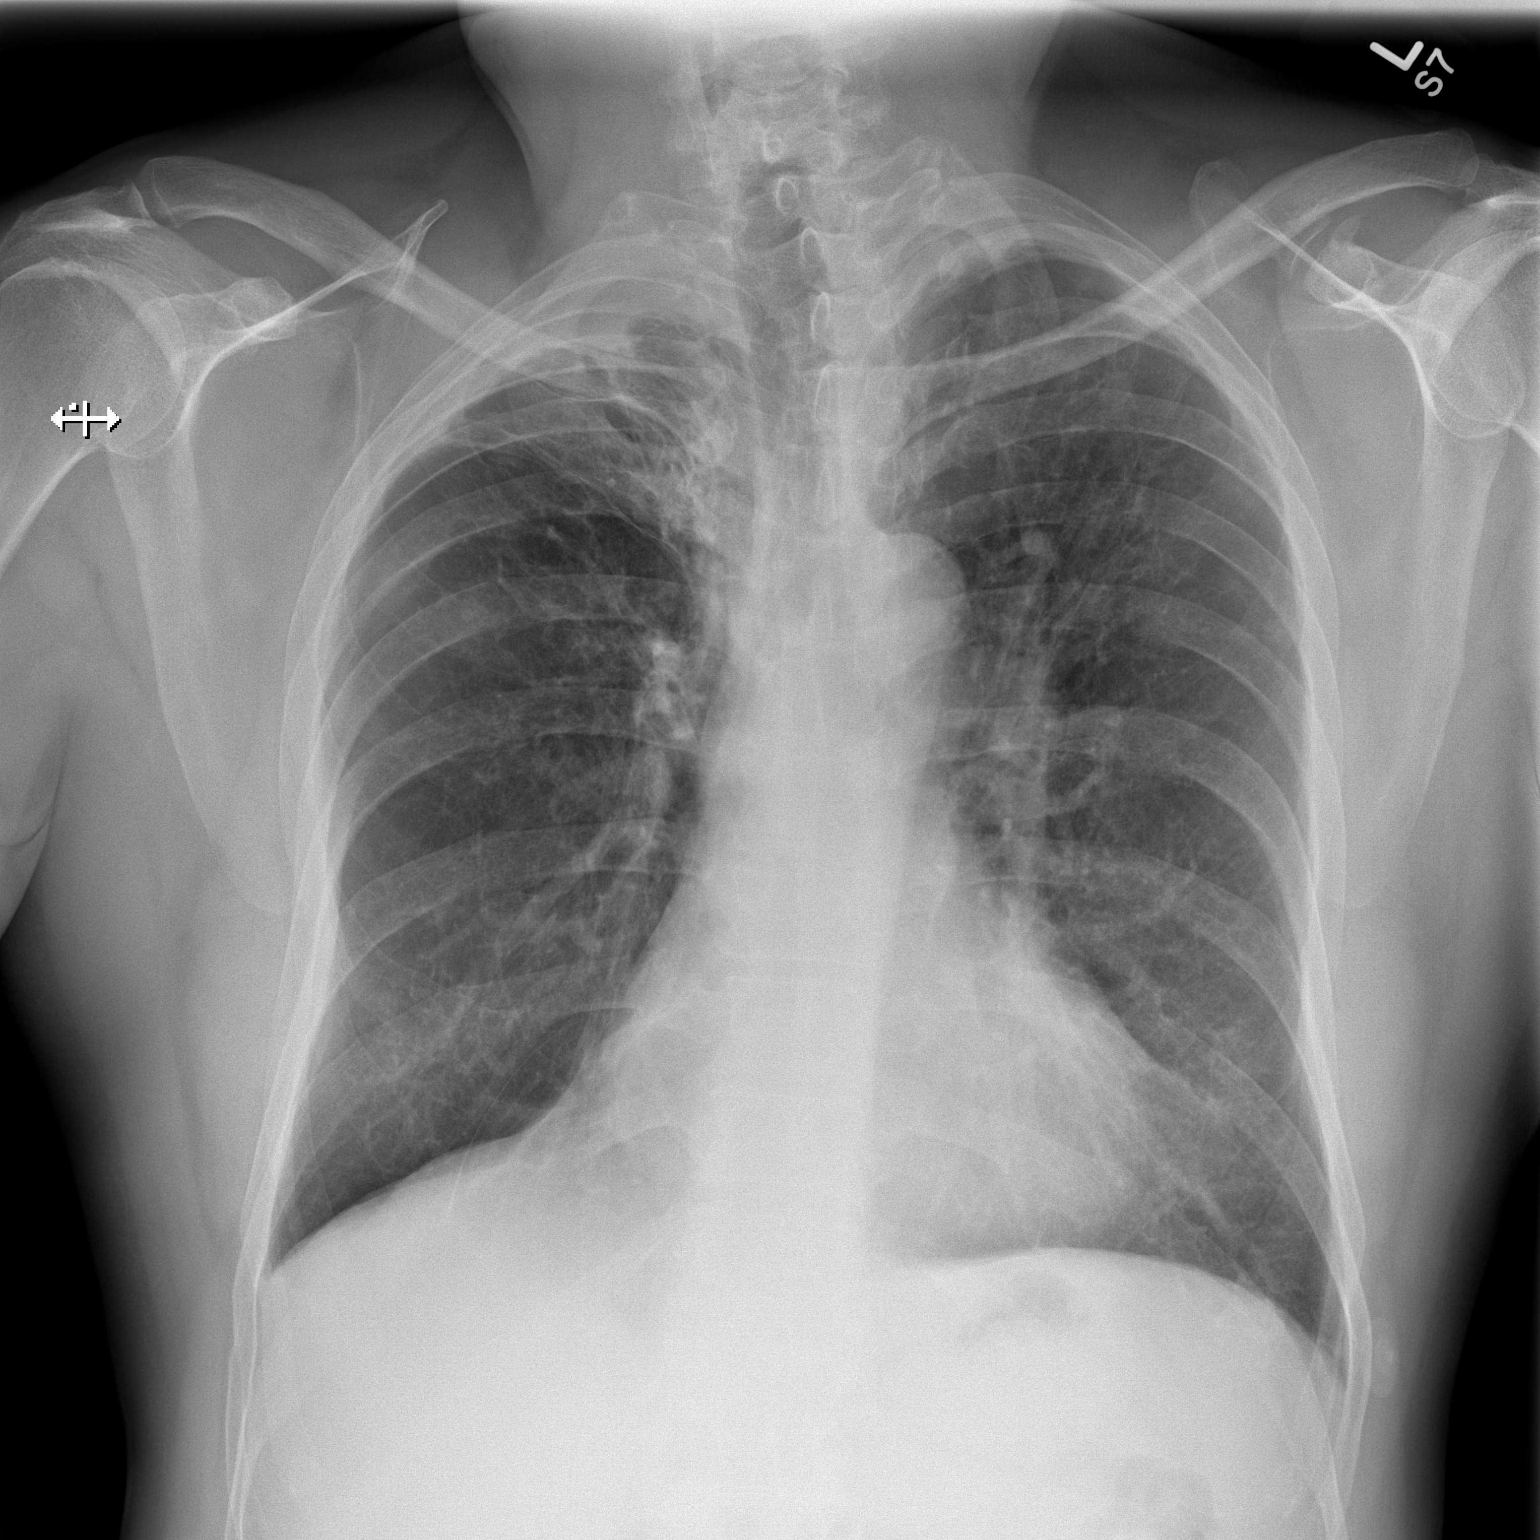

[w chest lat]
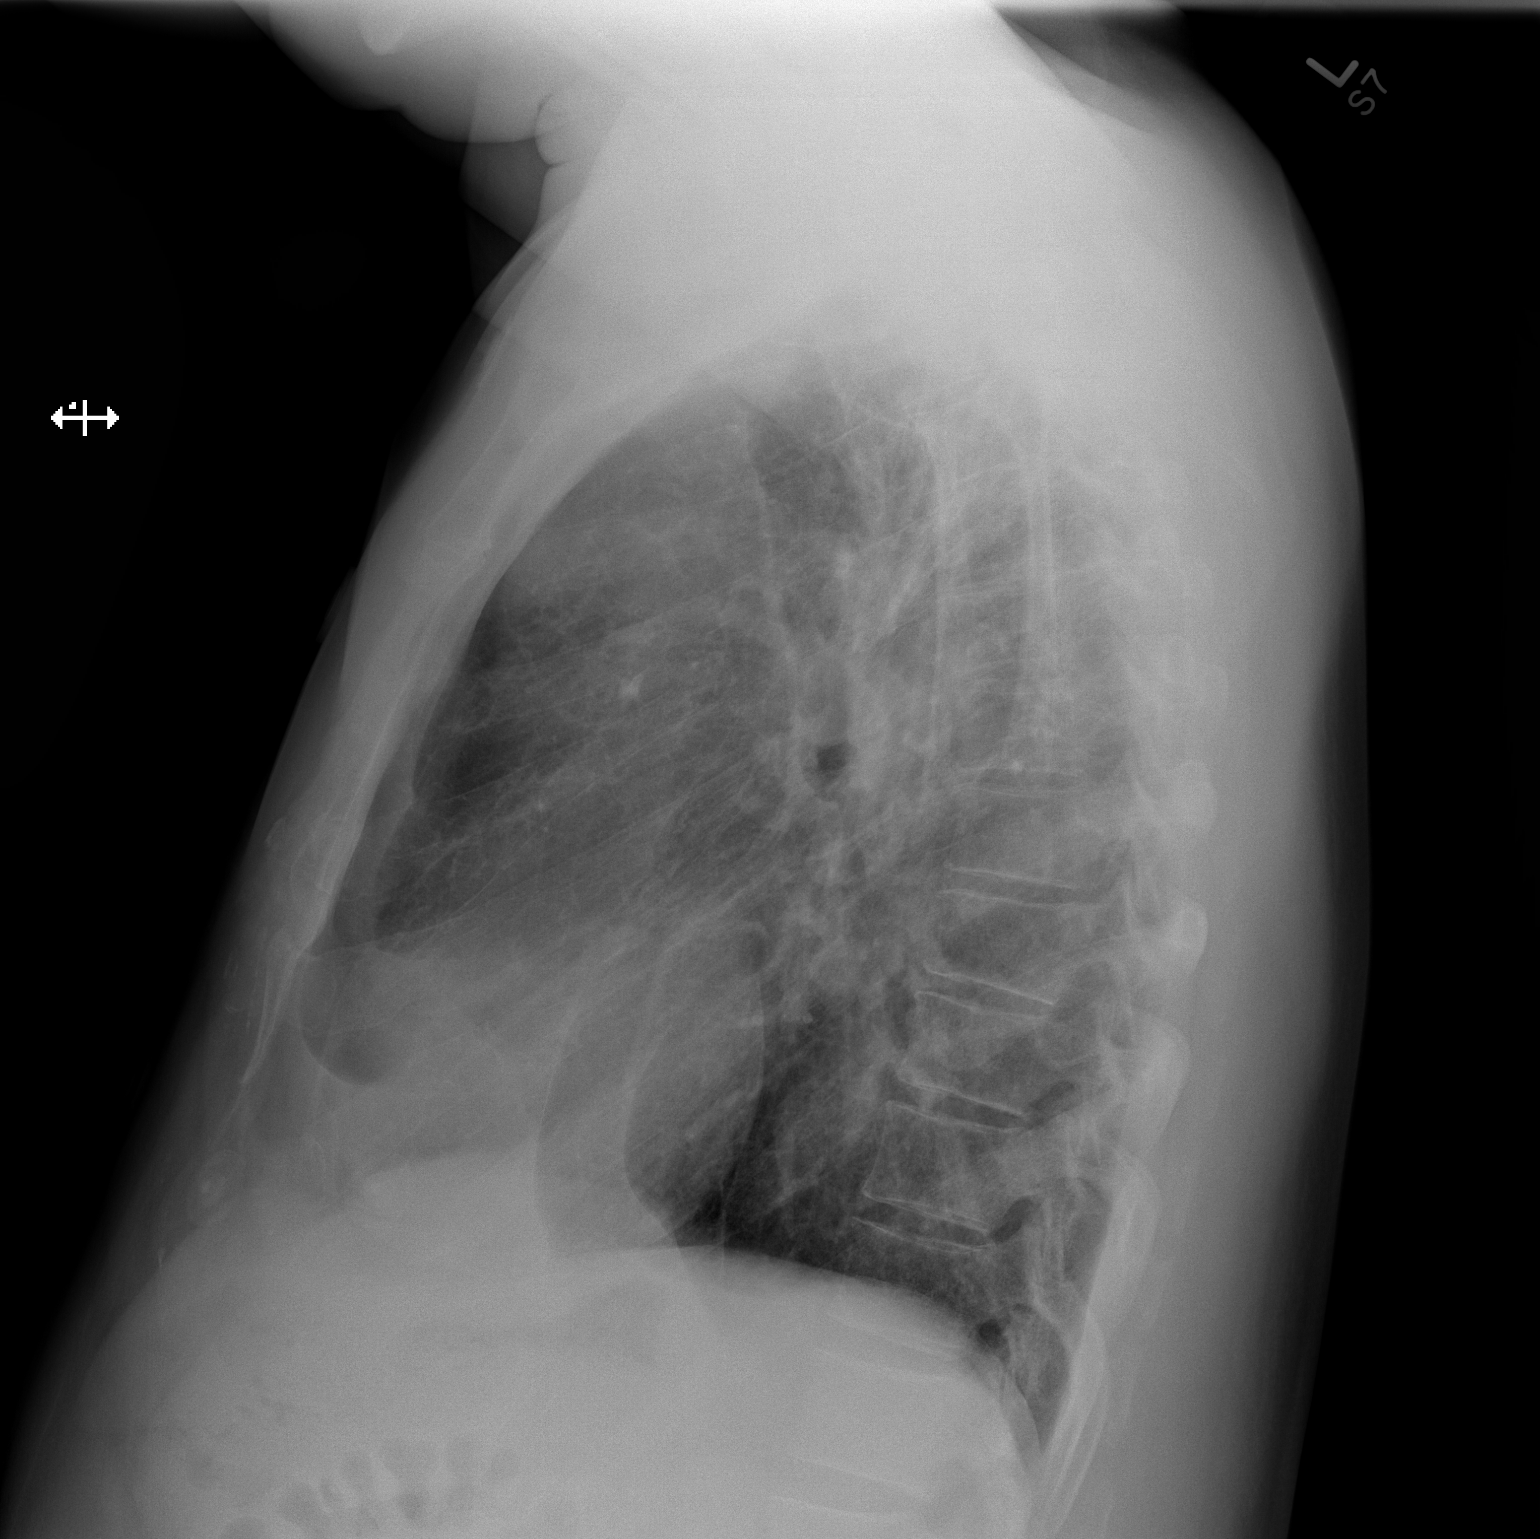

[2 of 2 positions shown; findings below may reference images not displayed]

FINDINGS: The lungs are well inflated. Cardiomediastinal contours are normal.
No pneumothorax or sizable pleural effusion.

There is right lung volume loss with right apical airspace opacity.
The left lung is clear. No pulmonary edema.
IMPRESSION: Right upper lobe airspace opacities with associated volume loss.
Atelectasis is favored, though pneumonia would be difficult to
exclude.

## 2017-08-30 ENCOUNTER — Emergency Department (HOSPITAL_COMMUNITY)
Admission: EM | Admit: 2017-08-30 | Discharge: 2017-08-30 | Disposition: A | Discharge status: Home / Self Care | Payer: Self-pay | Attending: Emergency Medicine | Admitting: Emergency Medicine

## 2017-08-30 ENCOUNTER — Encounter (HOSPITAL_COMMUNITY): Payer: Self-pay

## 2017-08-30 ENCOUNTER — Other Ambulatory Visit: Payer: Self-pay

## 2017-08-30 DIAGNOSIS — E119 Type 2 diabetes mellitus without complications: Secondary | ICD-10-CM | POA: Insufficient documentation

## 2017-08-30 DIAGNOSIS — Z7984 Long term (current) use of oral hypoglycemic drugs: Secondary | ICD-10-CM | POA: Insufficient documentation

## 2017-08-30 DIAGNOSIS — Z79899 Other long term (current) drug therapy: Secondary | ICD-10-CM | POA: Insufficient documentation

## 2017-08-30 DIAGNOSIS — J029 Acute pharyngitis, unspecified: Secondary | ICD-10-CM | POA: Insufficient documentation

## 2017-08-30 DIAGNOSIS — I10 Essential (primary) hypertension: Secondary | ICD-10-CM | POA: Insufficient documentation

## 2017-08-30 DIAGNOSIS — Z7982 Long term (current) use of aspirin: Secondary | ICD-10-CM | POA: Insufficient documentation

## 2017-08-30 DIAGNOSIS — F17210 Nicotine dependence, cigarettes, uncomplicated: Secondary | ICD-10-CM | POA: Insufficient documentation

## 2017-08-30 LAB — RAPID STREP SCREEN (MED CTR MEBANE ONLY): Streptococcus, Group A Screen (Direct): NEGATIVE

## 2017-08-30 MED ORDER — LIDOCAINE VISCOUS 2 % MT SOLN: 15.0000 mL | OROMUCOSAL | 0 refills | Status: DC | PRN

## 2017-08-30 MED ORDER — KETOROLAC TROMETHAMINE 30 MG/ML IJ SOLN
30.0000 mg | Freq: Once | INTRAMUSCULAR | Status: AC
  Administered 2017-08-30: 30 mg via INTRAMUSCULAR
  Filled 2017-08-30: qty 1

## 2017-08-30 NOTE — Discharge Instructions (Signed)
Please read attached information regarding your condition. Social and spit lidocaine solution as directed.  Do not swallow. Continue your home medications as previously prescribed. Return to ED for worsening symptoms, trouble breathing, trouble swallowing, wheezing, lightheadedness or loss of consciousness.

## 2017-08-30 NOTE — ED Triage Notes (Signed)
Patient complains of sore throat with runny nose x 1 week. States that the sore throat has become persistent. NAD

## 2017-08-30 NOTE — ED Provider Notes (Signed)
[facility] EMERGENCY DEPARTMENT Provider Note   CSN: [id number] Arrival date & time: 08/30/17  2924     History   Chief Complaint No chief complaint on file.   HPI Jose Deleon is a 57 y.o. male with a past medical history of HTN, DM, GERD, who presents to ED for evaluation of 1 week history of URI symptoms including cough, nasal congestion, bodyaches, which have resolved, but sore throat has persisted.  He has tried over-the-counter medications with improvement in his URI symptoms but not his sore throat.  Sick contacts at home with similar symptoms.  He did not receive his influenza vaccine this year.  He denies any fevers, trouble breathing, trouble swallowing, vomiting.  HPI  Past Medical History:  Diagnosis Date  . FH: heart disease   . GERD (gastroesophageal reflux disease)   . High cholesterol   . History of gout    "reaction from the TB RX"  . Hypertension 2015  . TB (pulmonary tuberculosis) 1990  . Type II diabetes mellitus ([place]) dx ~ 2015    Patient Active Problem List   Diagnosis Date Noted  . Chest pain at rest 03/11/2016  . Chest pain with high risk for cardiac etiology 03/11/2016  . STRONG FH: heart disease   . Tinea pedis of right foot 10/08/2015  . Insomnia 10/08/2015  . 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 Dental abscess 09/08/2014  . Current smoker 09/08/2014  . Onychomycosis 09/08/2014  . Diabetes mellitus without complication ([place])   . Hypertension     Past Surgical History:  Procedure Laterality Date  . CARDIAC CATHETERIZATION N/A 03/13/2016   Procedure: Left Heart Cath and Coronary Angiography;  Surgeon: Peter M Martinique, MD;  Location: [facility];  Service: Cardiovascular;  Laterality: N/A;  . [place]   "lanced"       Home Medications    Prior to Admission medications   Medication Sig Start Date End Date Taking? Authorizing Provider  aspirin 81 MG tablet 81 mg daily.    [provider]  atorvastatin (LIPITOR) 40 MG tablet Take 1 tablet (40 mg total) by mouth daily. 02/10/17   Funches, Adriana Mccallum, MD  Blood Glucose Monitoring Suppl (TRUERESULT BLOOD GLUCOSE) w/Device KIT 1 each by Does not apply route daily. 01/01/17   Funches, Adriana Mccallum, MD  Cinnamon 500 MG TABS Take by mouth.    [provider]  glucose blood (TRUETEST TEST) test strip 1 each by Other route 2 (two) times daily. Use as instructed 02/10/17   Boykin Nearing, MD  lidocaine (XYLOCAINE) 2 % solution Use as directed 15 mLs in the mouth or throat as needed for mouth pain. 08/30/17   Kenneth Lax, PA-C  lisinopril (PRINIVIL,ZESTRIL) 40 MG tablet Take 1 tablet (40 mg total) by mouth daily. 02/10/17   Boykin Nearing, MD  metFORMIN (GLUCOPHAGE) 850 MG tablet Take 1 tablet (850 mg total) by mouth daily with breakfast. 02/10/17   Boykin Nearing, MD  sildenafil (REVATIO) 20 MG tablet Take 4-5 tablets (80-100 mg total) by mouth daily as needed. erection 02/10/17   Boykin Nearing, MD    Family History Family History  Problem Relation Age of Onset  . Hypertension Mother   . Heart attack Mother 28  . Hypertension Father 75  . Multiple sclerosis Father   . Stroke Maternal Grandmother     Social History Social History   Tobacco Use  . Smoking status: Light Tobacco Smoker    Packs/day: 0.33  Years: 30.00    Pack years: 9.90    Types: Cigarettes    Last attempt to quit: 03/07/2016    Years since quitting: 1.4  . Smokeless tobacco: Never Used  Substance Use Topics  . Alcohol use: Yes    Alcohol/week: 2.4 oz    Types: 4 Cans of beer per week  . Drug use: No     Allergies   Patient has no known allergies.   Review of Systems Review of Systems  Constitutional: Negative for chills and fever.  HENT: Positive for congestion, rhinorrhea and sore throat. Negative for dental problem, drooling, ear discharge, ear pain, facial swelling, nosebleeds, postnasal drip, sinus pressure, sneezing,  trouble swallowing and voice change.   Respiratory: Negative for cough and choking.   Cardiovascular: Negative for chest pain.  Gastrointestinal: Negative for nausea and vomiting.  Musculoskeletal: Negative for neck pain and neck stiffness.     Physical Exam Updated Vital Signs BP (!) 164/98 (BP Location: Right Arm)   Pulse 71   Temp 98.1 F (36.7 C) (Oral)   Resp 18   Ht _0  (1.905 m)   Wt 93 kg (205 lb)   SpO2 97%   BMI 25.62 kg/m   Physical Exam  Constitutional: He appears well-developed and well-nourished. No distress.  HENT:  Head: Normocephalic and atraumatic.  Right Ear: Tympanic membrane normal.  Left Ear: Tympanic membrane normal.  Nose: Nose normal.  Mouth/Throat: Uvula is midline. No oral lesions. No trismus in the jaw. No dental abscesses or uvula swelling. Posterior oropharyngeal erythema present. No posterior oropharyngeal edema. Tonsils are 1+ on the right. Tonsils are 1+ on the left. No tonsillar exudate.  Bilateral, symmetrical tonsillar enlargement without uvula deviation. Patient does not appear to be in acute distress. No trismus or drooling present. No pooling of secretions. Patient is tolerating secretions and is not in respiratory distress. No neck pain or tenderness to palpation of the neck. Full active and passive range of motion of the neck. No evidence of RPA or PTA.  Eyes: Conjunctivae and EOM are normal. No scleral icterus.  Neck: Normal range of motion.  Pulmonary/Chest: Effort normal. No respiratory distress.  Neurological: He is alert.  Skin: No rash noted. He is not diaphoretic.  Psychiatric: He has a normal mood and affect.  Nursing note and vitals reviewed.    ED Treatments / Results  Labs (all labs ordered are listed, but only abnormal results are displayed) Labs Reviewed  RAPID STREP SCREEN (NOT AT [facility])  CULTURE, GROUP A STREP [facility])    EKG  EKG Interpretation None       Radiology No results  found.  Procedures Procedures (including critical care time)  Medications Ordered in ED Medications  ketorolac (TORADOL) 30 MG/ML injection 30 mg (30 mg Intramuscular Given 08/30/17 0842)     Initial Impression / Assessment and Plan / ED Course  I have reviewed the triage vital signs and the nursing notes.  Pertinent labs & imaging results that were available during my care of the patient were reviewed by me and considered in my medical decision making (see chart for details).     Patient presents to ED for evaluation of sore throat for the past week; URI symptoms that have resolved over the week as well. Reports no change in sore throat. On physical exam there is bilateral, symmetrical tonsillar enlargement without uvula deviation, trismus, drooling, signs of respiratory distress. No evidence of RPA or PTA on exam, and patient is tolerating  secretions without difficulty. Strep negative. I suspect viral pharyngitis as a part of his viral URI which is slowly resolving. Will give Toradol here, discharge with lidocaine to swish and spit as needed for throat discomfort. Advised to f/u with PCP for further evaluation if symptoms persist. Patient appears stable for discharge at this time. Strict return precautions given.  Portions of this note were generated with [employer]. Dictation errors may occur despite best attempts at proofreading.   Final Clinical Impressions(s) / ED Diagnoses   Final diagnoses:  Viral pharyngitis    ED Discharge Orders        Ordered    lidocaine (XYLOCAINE) 2 % solution  As needed     08/30/17 0837       Delia Heady, PA-C 08/30/17 9093    Margette Fast, MD 08/31/17 [id number]

## 2017-09-01 LAB — CULTURE, GROUP A STREP (THRC)

## 2018-03-02 ENCOUNTER — Other Ambulatory Visit: Payer: Self-pay

## 2018-03-16 ENCOUNTER — Encounter: Payer: Self-pay | Admitting: Family Medicine

## 2018-03-16 ENCOUNTER — Other Ambulatory Visit: Payer: Self-pay

## 2018-03-16 ENCOUNTER — Ambulatory Visit: Payer: Self-pay | Attending: Family Medicine | Admitting: Family Medicine

## 2018-03-16 VITALS — BP 151/81 | HR 62 | Temp 98.3°F | Resp 18 | Ht 75.0 in | Wt 198.8 lb

## 2018-03-16 DIAGNOSIS — Z7984 Long term (current) use of oral hypoglycemic drugs: Secondary | ICD-10-CM | POA: Insufficient documentation

## 2018-03-16 DIAGNOSIS — Z79899 Other long term (current) drug therapy: Secondary | ICD-10-CM

## 2018-03-16 DIAGNOSIS — E785 Hyperlipidemia, unspecified: Secondary | ICD-10-CM

## 2018-03-16 DIAGNOSIS — Z1211 Encounter for screening for malignant neoplasm of colon: Secondary | ICD-10-CM

## 2018-03-16 DIAGNOSIS — Z9889 Other specified postprocedural states: Secondary | ICD-10-CM | POA: Insufficient documentation

## 2018-03-16 DIAGNOSIS — E119 Type 2 diabetes mellitus without complications: Secondary | ICD-10-CM

## 2018-03-16 DIAGNOSIS — I10 Essential (primary) hypertension: Secondary | ICD-10-CM

## 2018-03-16 DIAGNOSIS — F17210 Nicotine dependence, cigarettes, uncomplicated: Secondary | ICD-10-CM | POA: Insufficient documentation

## 2018-03-16 MED ORDER — TRUERESULT BLOOD GLUCOSE W/DEVICE KIT: 1.0000 | PACK | Freq: Every day | 0 refills | Status: AC

## 2018-03-16 MED ORDER — ATORVASTATIN CALCIUM 40 MG PO TABS: 40.0000 mg | ORAL_TABLET | Freq: Every day | ORAL | 6 refills | Status: DC

## 2018-03-16 MED ORDER — ASPIRIN 81 MG PO TABS: 81.0000 mg | ORAL_TABLET | Freq: Every day | ORAL | 3 refills | Status: AC

## 2018-03-16 MED ORDER — METFORMIN HCL 850 MG PO TABS: 850.0000 mg | ORAL_TABLET | Freq: Every day | ORAL | 3 refills | Status: DC

## 2018-03-16 MED ORDER — GLUCOSE BLOOD VI STRP: 1.0000 | ORAL_STRIP | Freq: Two times a day (BID) | 3 refills | Status: AC

## 2018-03-16 MED ORDER — AMLODIPINE BESY-BENAZEPRIL HCL 5-20 MG PO CAPS: 1.0000 | ORAL_CAPSULE | Freq: Every day | ORAL | 6 refills | Status: DC

## 2018-03-16 NOTE — Progress Notes (Signed)
Patient declined a flu shot today. Patient stated he was here for refills and new pcp.

## 2018-03-16 NOTE — Progress Notes (Signed)
Subjective:    Patient ID: Jose Deleon, male    DOB: 1961-07-17, 57 y.o.   MRN: [id number]  HPI       57 yo male who has not been seen here in more than a year who returns to re-establish care. Patient states that he has been out of his blood pressure medication for about 2 weeks. Patient states that he would actually like to be on amlodopine-benazepril which his sister who has diabetes and is insulin dependent is currently taking.  Patient states that he has been taking lisinopril for years and does not really feel that this medication is effective.  Patient states that he has been out of test strips but when he was checking his blood sugars, morning blood sugars were generally 140 or less and nighttime nonfasting blood sugars were generally no higher than 165.  Patient does report some visual disturbance/decreased visual acuity and patient states that next week he does have an appointment for an eye exam.  Patient denies any increased thirst or urinary frequency.  Patient reports that he has been taking metformin for his diabetes without any issues.  Patient denies any headaches or dizziness related to his blood pressure.  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  Patient reports a family history of his maternal grandmother and grandfather both having hypertension and stroke.  Patient reports a maternal uncle with an MI.  Patient states that his mother also has heart disease and is status post an MI.  Patient states that he is currently single.  Patient is retired from [employer].  Patient has no children.  Patient smokes approximately half pack per day of cigarettes but in the past smoked 1 pack/day since age 14.  Patient states that he was prescribed atorvastatin in the past for hyperlipidemia  but patient states that he does not believe he took this medication for very long and that it has been years since he took this medication.  Patient does not recall having any issues with the medicine but he just did not feel that he needed to be on this medication.  Patient states that about 2 years ago he did have an episode of chest pain for which she went to the emergency department.  Patient states that he was hospitalized and had a heart cath which did not show any significant issues with his heart.  In patient also has not had a baseline colonoscopy and would be agreeable to being referred to gastroenterology.  Social History   Tobacco Use  . Smoking status: Light Tobacco Smoker    Packs/day: 0.33    Years: 30.00    Pack years: 9.90    Types: Cigarettes    Last attempt to quit: 03/07/2016    Years since quitting: 2.0  . Smokeless tobacco: Never Used  Substance Use Topics  . Alcohol use: Yes    Alcohol/week: 4.0 standard drinks    Types: 4 Cans of beer per week  . Drug use: No   Past Surgical History:  Procedure Laterality Date  . CARDIAC CATHETERIZATION N/A 03/13/2016   Procedure: Left Heart Cath and Coronary Angiography;  Surgeon: Peter M Martinique, MD;  Location: [facility];  Service: Cardiovascular;  Laterality: N/A;  . [place]   "  lanced"  No Known Allergies    Review of Systems  Constitutional: Negative for chills, fatigue and fever.  HENT: Negative for sore throat and trouble swallowing.   Respiratory: Negative for cough and shortness of breath.   Cardiovascular: Negative for chest pain, palpitations and leg swelling.  Gastrointestinal: Negative for abdominal pain, constipation, diarrhea and nausea.  Genitourinary: Negative for flank pain and frequency.  Musculoskeletal: Negative for gait problem and joint swelling.  Neurological: Negative for dizziness, light-headedness and headaches.  Hematological: Negative for adenopathy. Does not bruise/bleed  easily.  Psychiatric/Behavioral: Negative for sleep disturbance. The patient is not nervous/anxious.        Objective:   Physical Exam  Constitutional: He is oriented to person, place, and time. He appears well-developed and well-nourished. No distress.  HENT:  Right Ear: Tympanic membrane, external ear and ear canal normal.  Left Ear: Tympanic membrane, external ear and ear canal normal.  Nose: Nose normal.  Mouth/Throat: Oropharynx is clear and moist.  Poor dentition  Eyes: Conjunctivae and EOM are normal.  Neck: Normal range of motion. Neck supple. No JVD present.  Possible thyroid firmness  Cardiovascular: Normal rate and regular rhythm.  Pulses:      Dorsalis pedis pulses are 1+ on the right side, and 1+ on the left side.       Posterior tibial pulses are 2+ on the right side, and 1+ on the left side.  Pulmonary/Chest: Effort normal and breath sounds normal.  Abdominal: Soft. Bowel sounds are normal. There is no rebound and no guarding.  Musculoskeletal: He exhibits no edema or tenderness.       Right foot: There is normal range of motion.       Left foot: There is normal range of motion.  Feet:  Right Foot:  Protective Sensation: 10 sites tested. 10 sites sensed.  Skin Integrity: Positive for callus (on the side of the great toe) and dry skin. Negative for ulcer, blister, skin breakdown, erythema or warmth.  Left Foot:  Protective Sensation: 10 sites tested. 9 sites sensed.  Skin Integrity: Positive for dry skin. Negative for ulcer, blister, skin breakdown, erythema, warmth or callus.  Lymphadenopathy:    He has no cervical adenopathy.  Neurological: He is alert and oriented to person, place, and time.  Psychiatric: He has a normal mood and affect.  Vitals reviewed.       Assessment & Plan:  1. Type 2 diabetes mellitus without complication, without long-term current use of insulin ([place]) Patient with type 2 diabetes which he states has been well controlled per home  monitoring.  Patient however has recently run out of test strips and will need refills as well as new glucometer.  Patient is encouraged to take his atorvastatin secondary to increased risk of heart disease associated with diabetes.  Patient also given handout on diabetic foot care as patient with long, thickened nails and patient with absent monofilament sensation of the left heel on today's exam.  Patient will have CMP, lipid panel, urine microalbumin and hemoglobin A1c at today's visit.  Patient also advised to continue daily 81 mg aspirin secondary to increased risk of heart disease associated with diabetes.  Patient reports upcoming ophthalmology appointment and patient is encouraged to have diabetic eye exam and to have his eye care specialist send the results of his exam to this office.  Patient also has some poor dentition and patient states that he does have follow-up at [facility]. - aspirin 81 MG  tablet; Take 1 tablet (81 mg total) by mouth daily.  Dispense: 90 tablet; Refill: 3 - atorvastatin (LIPITOR) 40 MG tablet; Take 1 tablet (40 mg total) by mouth daily.  Dispense: 30 tablet; Refill: 6 - Blood Glucose Monitoring Suppl (TRUERESULT BLOOD GLUCOSE) w/Device KIT; 1 each by Does not apply route daily.  Dispense: 1 each; Refill: 0 - glucose blood (TRUETEST TEST) test strip; 1 each by Other route 2 (two) times daily. Use as instructed  Dispense: 180 each; Refill: 3 - metFORMIN (GLUCOPHAGE) 850 MG tablet; Take 1 tablet (850 mg total) by mouth daily with breakfast.  Dispense: 90 tablet; Refill: 3 - Lipid Panel - Hemoglobin A1c - Comprehensive metabolic panel  2. Essential hypertension Patient requests prescription to be on amlodipine- benazepril which his sister is currently taking.  Discussed with patient the importance of being compliant with blood pressure medication as well as all other prescribed medications.  Patient was asked to return to clinic in a few weeks after  starting the medication to have blood pressure rechecked.  Patient given prescription for amlodipine-benazepril 5-20 to take 1 pill daily. - amLODipine-benazepril (LOTREL) 5-20 MG capsule; Take 1 capsule by mouth daily.  Dispense: 30 capsule; Refill: 6  3. Hyperlipidemia, unspecified hyperlipidemia type Discussed with patient the importance of compliance with statin medication as diabetes can increase the risk of heart disease and patient with family history of heart disease.  Patient will have lipid panel will be notified if he needs to remain on the current dose or if this dose can be lowered based on the lab results. - atorvastatin (LIPITOR) 40 MG tablet; Take 1 tablet (40 mg total) by mouth daily.  Dispense: 30 tablet; Refill: 6 - Lipid Panel  4. Encounter for long-term (current) use of medications Patient will have a CMP in follow-up of long-term use of medications including Glucophage and statin medication both of which could be considered high risk - Comprehensive metabolic panel  5. Screening for colon cancer Patient will be referred for baseline screening colonoscopy - Ambulatory referral to Gastroenterology  -Patient declined influenza immunization at today's visit stating that he will receive this in October  After visit summary provided at today's visit  Return in about 4 months (around 07/16/2018) for BP/DM with CPP in 2-3 weeks; 4 months with PCP.

## 2018-03-16 NOTE — Patient Instructions (Signed)

## 2018-03-17 ENCOUNTER — Telehealth: Payer: Self-pay

## 2018-03-17 LAB — COMPREHENSIVE METABOLIC PANEL WITH GFR
ALT: 25 IU/L (ref 0–44)
AST: 20 IU/L (ref 0–40)
Albumin/Globulin Ratio: 1.5 (ref 1.2–2.2)
Albumin: 4.5 g/dL (ref 3.5–5.5)
Alkaline Phosphatase: 103 IU/L (ref 39–117)
BUN/Creatinine Ratio: 15 (ref 9–20)
BUN: 15 mg/dL (ref 6–24)
Bilirubin Total: 0.4 mg/dL (ref 0.0–1.2)
CO2: 25 mmol/L (ref 20–29)
Calcium: 9.3 mg/dL (ref 8.7–10.2)
Chloride: 99 mmol/L (ref 96–106)
Creatinine, Ser: 1.01 mg/dL (ref 0.76–1.27)
GFR calc Af Amer: 94 mL/min/1.73
GFR calc non Af Amer: 82 mL/min/1.73
Globulin, Total: 3.1 g/dL (ref 1.5–4.5)
Glucose: 90 mg/dL (ref 65–99)
Potassium: 4 mmol/L (ref 3.5–5.2)
Sodium: 140 mmol/L (ref 134–144)
Total Protein: 7.6 g/dL (ref 6.0–8.5)

## 2018-03-17 LAB — HEMOGLOBIN A1C
Est. average glucose Bld gHb Est-mCnc: 143 mg/dL
Hgb A1c MFr Bld: 6.6 % — ABNORMAL HIGH (ref 4.8–5.6)

## 2018-03-17 LAB — LIPID PANEL
Chol/HDL Ratio: 2.7 ratio (ref 0.0–5.0)
Cholesterol, Total: 215 mg/dL — ABNORMAL HIGH (ref 100–199)
HDL: 79 mg/dL
LDL Calculated: 111 mg/dL — ABNORMAL HIGH (ref 0–99)
Triglycerides: 124 mg/dL (ref 0–149)
VLDL Cholesterol Cal: 25 mg/dL (ref 5–40)

## 2018-03-17 NOTE — Telephone Encounter (Signed)
-----   Message from Cain Saupeammie Fulp, MD sent at 03/17/2018 11:46 AM EDT ----- Please notify patient of his recent lab results.  Patient's lipid panel with total cholesterol of 215 and LDL of 111.  Because patient is diabetic, would like to see his LDL below 100 and preferably 70 or less.  Patient should restart atorvastatin/Lipitor but he can take half pill (20 mg) daily.  Patient hemoglobin A1c was 6.6 showing that his blood sugars are very well controlled.  Patient's average blood sugar has been around 143 over the past 90 days.  Patient CMP which shows his electrolytes, creatinine (measurement of kidney function) and liver enzymes were normal.

## 2018-03-17 NOTE — Telephone Encounter (Signed)
Patient was called, no answer, lvm informing patient to return call at his earliest convenience. If the patient returns call Please notify patient of his recent lab results.  Patient's lipid panel with total cholesterol of 215 and LDL of 111.  Because patient is diabetic, would like to see his LDL below 100 and preferably 70 or less.  Patient should restart atorvastatin/Lipitor but he can take half pill (20 mg) daily.  Patient hemoglobin A1c was 6.6 showing that his blood sugars are very well controlled.  Patient's average blood sugar has been around 143 over the past 90 days.  Patient CMP which shows his electrolytes, creatinine (measurement of kidney function) and liver enzymes were normal.

## 2018-03-30 ENCOUNTER — Ambulatory Visit: Payer: Self-pay | Admitting: Pharmacist

## 2018-03-31 ENCOUNTER — Telehealth: Payer: Self-pay | Admitting: Family Medicine

## 2018-03-31 NOTE — Telephone Encounter (Signed)
Patient would like prescription for penicillin for swollen gums, please follow up with patient.

## 2018-04-01 ENCOUNTER — Other Ambulatory Visit: Payer: Self-pay | Admitting: Family Medicine

## 2018-04-01 DIAGNOSIS — R220 Localized swelling, mass and lump, head: Secondary | ICD-10-CM

## 2018-04-01 MED ORDER — AMOXICILLIN 500 MG PO CAPS: 500.0000 mg | ORAL_CAPSULE | Freq: Two times a day (BID) | ORAL | 0 refills | Status: DC

## 2018-04-01 NOTE — Progress Notes (Unsigned)
See phone encounter from 04/01/2018.  Patient requesting antibiotic for swollen gums.  Prescription for amoxicillin will be sent to patient's pharmacy.

## 2018-04-01 NOTE — Telephone Encounter (Signed)
Notify patient that prescription for amoxicillin was sent to his pharmacy.  Please ask patient to follow-up with the dentist.

## 2018-04-01 NOTE — Telephone Encounter (Signed)
Patient was called, answered, verified DOB and was informed and verbalized understanding. Patient stated that [facility], and they are supposed to take all of his teeth out and replace them with dentures. Patient stated that he just had a bad abscess tooth right now.

## 2018-04-19 ENCOUNTER — Encounter: Payer: Self-pay | Admitting: Family Medicine

## 2018-05-02 ENCOUNTER — Other Ambulatory Visit: Payer: Self-pay | Admitting: Family Medicine

## 2018-05-02 DIAGNOSIS — R220 Localized swelling, mass and lump, head: Secondary | ICD-10-CM

## 2018-05-03 ENCOUNTER — Emergency Department: Payer: Self-pay

## 2018-05-03 ENCOUNTER — Emergency Department
Admission: EM | Admit: 2018-05-03 | Discharge: 2018-05-03 | Disposition: A | Discharge status: Home / Self Care | Payer: Self-pay | Attending: Emergency Medicine | Admitting: Emergency Medicine

## 2018-05-03 ENCOUNTER — Other Ambulatory Visit: Payer: Self-pay

## 2018-05-03 ENCOUNTER — Encounter: Payer: Self-pay | Admitting: Radiology

## 2018-05-03 DIAGNOSIS — Z7982 Long term (current) use of aspirin: Secondary | ICD-10-CM | POA: Insufficient documentation

## 2018-05-03 DIAGNOSIS — K047 Periapical abscess without sinus: Secondary | ICD-10-CM | POA: Insufficient documentation

## 2018-05-03 DIAGNOSIS — Z79899 Other long term (current) drug therapy: Secondary | ICD-10-CM | POA: Insufficient documentation

## 2018-05-03 DIAGNOSIS — Z7984 Long term (current) use of oral hypoglycemic drugs: Secondary | ICD-10-CM | POA: Insufficient documentation

## 2018-05-03 DIAGNOSIS — F17210 Nicotine dependence, cigarettes, uncomplicated: Secondary | ICD-10-CM | POA: Insufficient documentation

## 2018-05-03 DIAGNOSIS — E119 Type 2 diabetes mellitus without complications: Secondary | ICD-10-CM | POA: Insufficient documentation

## 2018-05-03 DIAGNOSIS — I10 Essential (primary) hypertension: Secondary | ICD-10-CM | POA: Insufficient documentation

## 2018-05-03 DIAGNOSIS — L03211 Cellulitis of face: Secondary | ICD-10-CM | POA: Insufficient documentation

## 2018-05-03 LAB — CBC WITH DIFFERENTIAL/PLATELET
Abs Immature Granulocytes: 0.03 10*3/uL (ref 0.00–0.07)
BASOS ABS: 0.1 10*3/uL (ref 0.0–0.1)
Basophils Relative: 1 %
EOS ABS: 0.2 10*3/uL (ref 0.0–0.5)
EOS PCT: 2 %
HCT: 45.9 % (ref 39.0–52.0)
HEMOGLOBIN: 14.7 g/dL (ref 13.0–17.0)
Immature Granulocytes: 0 %
LYMPHS PCT: 27 %
Lymphs Abs: 2.7 10*3/uL (ref 0.7–4.0)
MCH: 29.9 pg (ref 26.0–34.0)
MCHC: 32 g/dL (ref 30.0–36.0)
MCV: 93.5 fL (ref 80.0–100.0)
Monocytes Absolute: 1.2 10*3/uL — ABNORMAL HIGH (ref 0.1–1.0)
Monocytes Relative: 12 %
NEUTROS PCT: 58 %
NRBC: 0 % (ref 0.0–0.2)
Neutro Abs: 5.7 10*3/uL (ref 1.7–7.7)
Platelets: 278 10*3/uL (ref 150–400)
RBC: 4.91 MIL/uL (ref 4.22–5.81)
RDW: 13.1 % (ref 11.5–15.5)
WBC: 9.9 10*3/uL (ref 4.0–10.5)

## 2018-05-03 LAB — BASIC METABOLIC PANEL
Anion gap: 9 (ref 5–15)
BUN: 16 mg/dL (ref 6–20)
CO2: 27 mmol/L (ref 22–32)
Calcium: 9.3 mg/dL (ref 8.9–10.3)
Chloride: 103 mmol/L (ref 98–111)
Creatinine, Ser: 0.8 mg/dL (ref 0.61–1.24)
GFR calc non Af Amer: >60 mL/min (ref >60)
Glucose, Bld: 115 mg/dL — ABNORMAL HIGH (ref 70–99)
Potassium: 3.9 mmol/L (ref 3.5–5.1)
SODIUM: 139 mmol/L (ref 135–145)

## 2018-05-03 LAB — LACTIC ACID, PLASMA: LACTIC ACID, VENOUS: 1 mmol/L (ref 0.5–1.9)

## 2018-05-03 MED ORDER — PREDNISONE 20 MG PO TABS: 60.0000 mg | ORAL_TABLET | Freq: Every day | ORAL | 0 refills | Status: DC

## 2018-05-03 MED ORDER — IOHEXOL 300 MG/ML  SOLN
75.0000 mL | Freq: Once | INTRAMUSCULAR | Status: AC | PRN
  Administered 2018-05-03: 75 mL via INTRAVENOUS

## 2018-05-03 MED ORDER — AMOXICILLIN 500 MG PO CAPS: 500.0000 mg | ORAL_CAPSULE | Freq: Two times a day (BID) | ORAL | 0 refills | Status: DC

## 2018-05-03 MED ORDER — DEXAMETHASONE SODIUM PHOSPHATE 10 MG/ML IJ SOLN
10.0000 mg | Freq: Once | INTRAMUSCULAR | Status: AC
  Administered 2018-05-03: 10 mg via INTRAVENOUS
  Filled 2018-05-03: qty 1

## 2018-05-03 MED ORDER — AMPICILLIN-SULBACTAM SODIUM 3 (2-1) G IJ SOLR
3.0000 g | INTRAMUSCULAR | Status: AC
  Administered 2018-05-03: 3 g via INTRAVENOUS
  Filled 2018-05-03: qty 3

## 2018-05-03 MED ORDER — AMOXICILLIN-POT CLAVULANATE 875-125 MG PO TABS: 1.0000 | ORAL_TABLET | Freq: Two times a day (BID) | ORAL | 0 refills | Status: DC

## 2018-05-03 MED ORDER — KETOROLAC TROMETHAMINE 30 MG/ML IJ SOLN
15.0000 mg | Freq: Once | INTRAMUSCULAR | Status: AC
  Administered 2018-05-03: 15 mg via INTRAVENOUS
  Filled 2018-05-03: qty 1

## 2018-05-03 MED ORDER — METRONIDAZOLE 500 MG PO TABS: 500.0000 mg | ORAL_TABLET | Freq: Three times a day (TID) | ORAL | 0 refills | Status: AC

## 2018-05-03 NOTE — ED Triage Notes (Signed)
Reports recently treated for a dental abscess.  States yesterday appeared to be getting worse, swelling and pain moving upward towards right eye.

## 2018-05-03 NOTE — ED Provider Notes (Signed)
[facility] Emergency Department Provider Note  ____________________________________________   First MD Initiated Contact with Patient 05/03/18 [phone]     (approximate)  I have reviewed the triage vital signs and the nursing notes.   HISTORY  Chief Complaint Dental Pain    HPI Jose Deleon is a 57 y.o. male with extensive chronic dental caries and poor dentition in general who is on a waiting list for a special program at the [facility] to have his teeth pulled and replaced with dentures.  He presents by private vehicle today for worsening pain and swelling in the left side of his face.  He reports that about a month ago his regular doctor gave him a prescription for amoxicillin 500 mg twice daily because he was starting to develop some swelling around his gums on the left upper part of his mouth.  He just finished the antibiotics a few days ago and shortly after he finished the swelling is gotten a lot worse with swelling and pain all throughout the left side of his face and cheek extending up towards his eye.  He is not having trouble with his vision and no swelling around the eye but he states that the swelling is significant enough in the cheek that it feels like he has to force his eye open.  He describes the symptoms as severe and gradual in onset over the last few days.  Nothing in particular makes his symptoms better or worse.  He denies fever/chills, chest pain, shortness of breath, nausea, vomiting, and abdominal pain.  He denies any known allergies to antibiotics.  He is having no difficulty breathing or swallowing.  He has diabetes for which he takes metformin but not insulin.  He has been on the waiting list at the [facility] for several months.  Past Medical History:  Diagnosis Date  . FH: heart disease   . GERD (gastroesophageal reflux disease)   . High cholesterol   . History of gout    "reaction from the TB RX"  .  Hypertension 2015  . TB (pulmonary tuberculosis) 1990  . Type II diabetes mellitus ([place]) dx ~ 2015    Patient Active Problem List   Diagnosis Date Noted  . Chest pain at rest 03/11/2016  . Chest pain with high risk for cardiac etiology 03/11/2016  . STRONG FH: heart disease   . Tinea pedis of right foot 10/08/2015  . Insomnia 10/08/2015  . 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 Dental abscess 09/08/2014  . Current smoker 09/08/2014  . Onychomycosis 09/08/2014  . Diabetes mellitus without complication ([place])   . Hypertension     Past Surgical History:  Procedure Laterality Date  . CARDIAC CATHETERIZATION N/A 03/13/2016   Procedure: Left Heart Cath and Coronary Angiography;  Surgeon: Peter M Martinique, MD;  Location: [facility];  Service: Cardiovascular;  Laterality: N/A;  . [place]   "lanced"    Prior to Admission medications   Medication Sig Start Date End Date Taking? Authorizing Provider  amLODipine-benazepril (LOTREL) 5-20 MG capsule Take 1 capsule by mouth daily. 03/16/18   Fulp, Cammie, MD  amoxicillin (AMOXIL) 500 MG capsule Take 1 capsule (500 mg total) by mouth 2 (two) times daily. 04/01/18   Fulp, Cammie, MD  amoxicillin-clavulanate (AUGMENTIN) 875-125 MG tablet Take 1 tablet by mouth every 12 (twelve) hours for 10 days. 05/03/18 05/13/18  Hinda Kehr, MD  Apple Cider Vinegar 300 MG TABS Take  by mouth every morning.    [provider]  aspirin 81 MG tablet Take 1 tablet (81 mg total) by mouth daily. 03/16/18   Fulp, Cammie, MD  atorvastatin (LIPITOR) 40 MG tablet Take 1 tablet (40 mg total) by mouth daily. 03/16/18   Fulp, Cammie, MD  Blood Glucose Monitoring Suppl (TRUERESULT BLOOD GLUCOSE) w/Device KIT 1 each by Does not apply route daily. 03/16/18   Fulp, Cammie, MD  Cinnamon 500 MG TABS Take by mouth.    [provider]  Garlic 297 MG TABS Take by mouth every morning.    [provider]  glucose blood (TRUETEST  TEST) test strip 1 each by Other route 2 (two) times daily. Use as instructed 03/16/18   Fulp, Cammie, MD  lidocaine (XYLOCAINE) 2 % solution Use as directed 15 mLs in the mouth or throat as needed for mouth pain. Patient not taking: Reported on 03/16/2018 08/30/17   Delia Heady, PA-C  metFORMIN (GLUCOPHAGE) 850 MG tablet Take 1 tablet (850 mg total) by mouth daily with breakfast. 03/16/18   Fulp, Cammie, MD  metroNIDAZOLE (FLAGYL) 500 MG tablet Take 1 tablet (500 mg total) by mouth 3 (three) times daily for 10 days. 05/03/18 05/13/18  Hinda Kehr, MD  predniSONE (DELTASONE) 20 MG tablet Take 3 tablets (60 mg total) by mouth daily. 05/03/18   Hinda Kehr, MD  sildenafil (REVATIO) 20 MG tablet Take 4-5 tablets (80-100 mg total) by mouth daily as needed. erection Patient not taking: Reported on 03/16/2018 02/10/17   Boykin Nearing, MD    Allergies Patient has no known allergies.  Family History  Problem Relation Age of Onset  . Hypertension Mother   . Heart attack Mother 65  . Hypertension Father 40  . Multiple sclerosis Father   . Stroke Maternal Grandmother     Social History Social History   Tobacco Use  . Smoking status: Light Tobacco Smoker    Packs/day: 0.33    Years: 30.00    Pack years: 9.90    Types: Cigarettes    Last attempt to quit: 03/07/2016    Years since quitting: 2.1  . Smokeless tobacco: Never Used  Substance Use Topics  . Alcohol use: Yes    Alcohol/week: 4.0 standard drinks    Types: 4 Cans of beer per week  . Drug use: No    Review of Systems Constitutional: No fever/chills Eyes: No visual changes. ENT: Swelling and pain to the left side of his face thought to be due to dental infection. Cardiovascular: Denies chest pain. Respiratory: Denies shortness of breath. Gastrointestinal: No abdominal pain.  No nausea, no vomiting.  No diarrhea.  No constipation. Genitourinary: Negative for dysuria. Musculoskeletal: Negative for neck pain.  Negative for back  pain. Integumentary: Negative for rash. Neurological: Negative for headaches, focal weakness or numbness.   ____________________________________________   PHYSICAL EXAM:  VITAL SIGNS: ED Triage Vitals  Enc Vitals Group     BP 05/03/18 0216 (!) 155/92     Pulse Rate 05/03/18 0216 65     Resp 05/03/18 0216 18     Temp 05/03/18 0216 98.1 F (36.7 C)     Temp Source 05/03/18 0216 Oral     SpO2 05/03/18 0216 96 %     Weight 05/03/18 0215 93 kg (205 lb)     Height 05/03/18 0215 1.905 m (_0 )     Head Circumference --      Peak Flow --      Pain Score  05/03/18 0215 10     Pain Loc --      Pain Edu? --      Excl. in [place]? --     Constitutional: Alert and oriented. Appears uncomfortable. Eyes: Conjunctivae are normal.  Head/Face: Atraumatic.  Obvious swelling to the left side of his face with induration of the left cheek and swelling that extends up towards the eye but does not include periorbital edema.  No involvement of the orbit.  Tender to palpation throughout the left side of the face/cheek. Nose: No congestion/rhinnorhea. Mouth/Throat: Mucous membranes are moist.  Very poor dentition chronically with multiple missing teeth and extensive dental caries.  There is some swelling around the gums on the left upper part of the mouth but no obvious drainable fluid collection. Neck: No stridor.  No meningeal signs.   Cardiovascular: Normal rate, regular rhythm. Good peripheral circulation. Grossly normal heart sounds. Respiratory: Normal respiratory effort.  No retractions. Lungs CTAB. Gastrointestinal: Soft and nontender. No distention.  Musculoskeletal: No lower extremity tenderness nor edema. No gross deformities of extremities. Neurologic:  Normal speech and language. No gross focal neurologic deficits are appreciated.  Skin:  Skin is warm, dry and intact. No rash noted. Psychiatric: Mood and affect are normal. Speech and behavior are  normal.  ____________________________________________   LABS (all labs ordered are listed, but only abnormal results are displayed)  Labs Reviewed  CBC WITH DIFFERENTIAL/PLATELET - Abnormal; Notable for the following components:      Result Value   Monocytes Absolute 1.2 (*)    All other components within normal limits  BASIC METABOLIC PANEL - Abnormal; Notable for the following components:   Glucose, Bld 115 (*)    All other components within normal limits  LACTIC ACID, PLASMA   ____________________________________________  EKG  None - EKG not ordered by ED physician ____________________________________________  RADIOLOGY   ED MD interpretation: Odontogenic facial cellulitis and dental abscess  Official radiology report(s): Ct Maxillofacial W Contrast  Result Date: 05/03/2018 CLINICAL DATA:  Facial swelling. Recent treatment for dental abscess. EXAM: CT MAXILLOFACIAL WITH CONTRAST TECHNIQUE: Multidetector CT imaging of the maxillofacial structures was performed with intravenous contrast. Multiplanar CT image reconstructions were also generated. CONTRAST:  52m OMNIPAQUE IOHEXOL 300 MG/ML  SOLN COMPARISON:  None. FINDINGS: Osseous: No facial fracture. Dental: There is markedly poor dentition. There are numerous dental caries and large periapical lucencies. At the left anterior aspect of the maxilla, there is a subperiosteal abscess measuring approximately 15 x 3 mm. This directly overlies a periapical lucency of tooth 12, where there is dehiscence of the anterior maxillary cortex, which is a likely source of the infection. The exposed root of tooth 11 also is in apparent communication the collection. Orbits: The globes are intact. Normal appearance of the intra- and extraconal fat. Symmetric extraocular muscles. Sinuses: No fluid levels or advanced mucosal thickening. Soft tissues: There is mild soft tissue inflammation overlying the above-described subperiosteal abscess. Limited  intracranial: Normal. IMPRESSION: 1. Odontogenic cellulitis with subperiosteal abscess arising from the apices of teeth 11 and 12, where there is focal dehiscence of the anterior maxilla. 2. Severely poor dentition with numerous dental caries and periapical lucencies. The Electronically Signed   By: KUlyses JarredM.D.   On: 05/03/2018 05:20    ____________________________________________   PROCEDURES  Critical Care performed: No   Procedure(s) performed:   Procedures   ____________________________________________   INITIAL IMPRESSION / ASSESSMENT AND PLAN / ED COURSE  As part of my medical decision making,  I reviewed the following data within the [facility] notes reviewed and incorporated, Labs reviewed , Old chart reviewed, Discussed with OMFS at [facility], and Notes from prior ED visits    Differential diagnosis includes, but is not limited to, odontogenic facial cellulitis/abscess, other source of facial cellulitis, orbital cellulitis or preseptal cellulitis, deep tissue neck infection.  The patient has no submandibular induration or lymphadenopathy and his symptoms seem confined to the left side of his face for now.  However given the relatively rapid onset after stopping the amoxicillin several days ago I feel it is appropriate and necessary to check a CT scan of his face with IV contrast to look for signs of cellulitis and/or abscess and to determine the extent and depth of the infection.  I am treating empirically with Unasyn 3 g IV and Decadron 10 mg IV to help with the swelling.  The choice of Unasyn was based on up-to-date recommendations for antimicrobial regimens for odontogenic soft tissue infection.  Additional antibiotics can be added on later if necessary.  Fortunately the patient has no evidence of any systemic illness at this time but I am checking basic lab work including a lactic acid.  After the imaging I will determine the appropriate disposition.   He understands and agrees with the plan.  Clinical Course as of May 04 635  Mon May 03, 2018  0436 WBC: 9.9 [CF]  0436 Lactic Acid, Venous: 1.0 [CF]  0436 Creatinine: 0.80 [CF]  0554 The patient has extensive dental abscess with maxillary involvement and direct communication with the root of 1 or 2 teeth.  Given that he just recently completed outpatient antibiotics and has developed a facial cellulitis with abscesses, I am contacting you and see oral maxillofacial surgery to discuss the case and see if he would be appropriate for transfer for urgent evaluation.   [CF]  [id number] I spoke by phone with Dr. Eulas Post Reside [facility] OMFS) and we discussed the case in detail.  He does not feel the patient needs to be transferred emergently to [facility], but since the patient has his own vehicle and is able to drive himself, he said the patient should come to the [facility] dental school department of oral surgery to be seen in their urgent care this morning.  He can come as early as 8 AM but recommends coming no later than 10 AM.  We will power push the imaging so that it is available and the [facility] transfer center is establishing medical record number for the patient so that the images can be available.  Because I know that sometimes patients change their mind or have other obligations, I suggested that I prescribe antibiotics and some prednisone just in case, and Dr. Reside agreed with that plan.  I explained it to the patient but stressed at that he should not fill the prescriptions and instead just go directly to [facility] first thing in the morning.  He states that he understands the importance of close follow-up.   I gave my usual and customary return precautions.  I also gave a dose of Toradol 50 mg IV prior to discharge for pain relief and because he is driving he cannot get narcotics.   [CF]    Clinical Course User Index [CF] Hinda Kehr, MD    ____________________________________________  FINAL CLINICAL IMPRESSION(S) /  ED DIAGNOSES  Final diagnoses:  Dental infection  Apical abscess  Facial cellulitis     MEDICATIONS GIVEN DURING THIS VISIT:  Medications  dexamethasone (DECADRON) injection 10 mg (10 mg Intravenous Given 05/03/18 0401)  Ampicillin-Sulbactam (UNASYN) 3 g in sodium chloride 0.9 % 100 mL IVPB (0 g Intravenous Stopped 05/03/18 0432)  iohexol (OMNIPAQUE) 300 MG/ML solution 75 mL (75 mLs Intravenous Contrast Given 05/03/18 0434)  ketorolac (TORADOL) 30 MG/ML injection 15 mg (15 mg Intravenous Given 05/03/18 0618)     ED Discharge Orders         Ordered    amoxicillin-clavulanate (AUGMENTIN) 875-125 MG tablet  Every 12 hours     05/03/18 0617    metroNIDAZOLE (FLAGYL) 500 MG tablet  3 times daily     05/03/18 0617    predniSONE (DELTASONE) 20 MG tablet  Daily     05/03/18 0617           Note:  This document was prepared using Dragon voice recognition software and may include unintentional dictation errors.    Hinda Kehr, MD 05/03/18 [phone]

## 2018-05-03 NOTE — Discharge Instructions (Signed)
As we discussed, you have a dental infection that is spreading into your face.  We strongly encourage you to go to the [facility] dental school by 8 AM today, no later than 10 AM.  Look for the department of oral surgery on the first floor of the dental school; they have an urgent care where you will be able to be seen.  The doctor with whom I spoke is Dr. Reside and you can let them know that the Emergency Department (ED) physician spoke with Dr. Reside by phone and he knows you are coming.  The CT scan from your ED visit has been sent electronically to [facility].  If for some reason you cannot go to the dental school this morning like we recommend, please fill the prescriptions you were given and take them for the full course of treatment.  However, we recommend that you do not fill the prescriptions at this time and rather follow-up at the dental school.

## 2018-05-03 NOTE — Telephone Encounter (Signed)
Patient was seen in the ED this weekend and was scheduled to go to the [facility] dental emergency walk-in clinic this morning. Please follow-up with patient later this afternoon or tomorrow and see if he went to the dental clinic

## 2018-05-03 NOTE — ED Notes (Signed)
Pt updated on progression of care plan. Pt verbalizes understanding.

## 2018-05-03 NOTE — Telephone Encounter (Signed)
Antibiotic refill request for swollen gums

## 2018-05-21 ENCOUNTER — Encounter: Payer: Self-pay | Admitting: Family Medicine

## 2018-07-16 ENCOUNTER — Ambulatory Visit: Payer: Self-pay | Admitting: Family Medicine

## 2018-08-01 ENCOUNTER — Encounter (HOSPITAL_COMMUNITY): Payer: Self-pay | Admitting: *Deleted

## 2018-08-01 ENCOUNTER — Emergency Department (HOSPITAL_COMMUNITY)
Admission: EM | Admit: 2018-08-01 | Discharge: 2018-08-01 | Disposition: A | Discharge status: Home / Self Care | Payer: No Typology Code available for payment source | Attending: Emergency Medicine | Admitting: Emergency Medicine

## 2018-08-01 DIAGNOSIS — Z79899 Other long term (current) drug therapy: Secondary | ICD-10-CM | POA: Insufficient documentation

## 2018-08-01 DIAGNOSIS — E119 Type 2 diabetes mellitus without complications: Secondary | ICD-10-CM | POA: Insufficient documentation

## 2018-08-01 DIAGNOSIS — Z720 Tobacco use: Secondary | ICD-10-CM | POA: Diagnosis not present

## 2018-08-01 DIAGNOSIS — I10 Essential (primary) hypertension: Secondary | ICD-10-CM | POA: Insufficient documentation

## 2018-08-01 DIAGNOSIS — Z7982 Long term (current) use of aspirin: Secondary | ICD-10-CM | POA: Diagnosis not present

## 2018-08-01 DIAGNOSIS — M7918 Myalgia, other site: Secondary | ICD-10-CM | POA: Diagnosis present

## 2018-08-01 MED ORDER — CYCLOBENZAPRINE HCL 10 MG PO TABS: 10.0000 mg | ORAL_TABLET | Freq: Two times a day (BID) | ORAL | 0 refills | Status: DC | PRN

## 2018-08-01 MED ORDER — IBUPROFEN 600 MG PO TABS: 600.0000 mg | ORAL_TABLET | Freq: Four times a day (QID) | ORAL | 0 refills | Status: DC | PRN
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
## 2018-08-23 ENCOUNTER — Encounter: Payer: Self-pay | Admitting: Emergency Medicine

## 2018-08-23 ENCOUNTER — Ambulatory Visit
Admission: EM | Admit: 2018-08-23 | Discharge: 2018-08-23 | Disposition: A | Discharge status: Home / Self Care | Payer: Self-pay | Attending: Family Medicine | Admitting: Family Medicine

## 2018-08-23 DIAGNOSIS — H1032 Unspecified acute conjunctivitis, left eye: Secondary | ICD-10-CM

## 2018-08-23 MED ORDER — ERYTHROMYCIN 5 MG/GM OP OINT: TOPICAL_OINTMENT | OPHTHALMIC | 0 refills | Status: DC
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
## 2018-08-23 NOTE — Discharge Instructions (Signed)
NO abrasion or obvious foreign body noted in eye Begin using erythromycin ointment 4-6 times a day for the next week If you develop worsening eye pain, change in vision, swelling, please follow-up in the emergency room If symptoms persisting with continued sensation of foreign body in your eye please follow-up with ophthalmology for further evaluation of this, please use contact information below
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
## 2018-08-23 NOTE — ED Notes (Signed)
Patient able to ambulate independently  

## 2018-10-20 ENCOUNTER — Other Ambulatory Visit: Payer: Self-pay | Admitting: Family Medicine

## 2018-10-20 DIAGNOSIS — I10 Essential (primary) hypertension: Secondary | ICD-10-CM

## 2018-10-23 ENCOUNTER — Other Ambulatory Visit: Payer: Self-pay | Admitting: Family Medicine

## 2018-10-23 DIAGNOSIS — I10 Essential (primary) hypertension: Secondary | ICD-10-CM

## 2018-10-28 ENCOUNTER — Telehealth: Payer: Self-pay | Admitting: Family Medicine

## 2018-10-28 ENCOUNTER — Ambulatory Visit: Payer: Self-pay | Admitting: Family Medicine

## 2018-10-28 DIAGNOSIS — I10 Essential (primary) hypertension: Secondary | ICD-10-CM

## 2018-10-28 MED ORDER — AMLODIPINE BESY-BENAZEPRIL HCL 5-20 MG PO CAPS: 1.0000 | ORAL_CAPSULE | Freq: Every day | ORAL | 1 refills | Status: DC

## 2018-10-28 NOTE — Telephone Encounter (Signed)
Patient last seen 02/2018. Patient seen most recent in ED. Patient requesting BP refills due to appointment being rescheduled.

## 2018-10-28 NOTE — Telephone Encounter (Signed)
Pt called back in regards to reschedule appt due to provider being out pt stated he needed medication filled cause he has been out of his BP medication for 3 days advised him he would need to go to urgent care for something sooner pt got upset

## 2018-10-28 NOTE — Telephone Encounter (Signed)
Refilled

## 2018-11-01 ENCOUNTER — Ambulatory Visit: Payer: Self-pay | Attending: Family Medicine | Admitting: Primary Care

## 2018-11-01 ENCOUNTER — Other Ambulatory Visit: Payer: Self-pay

## 2018-11-01 ENCOUNTER — Encounter: Payer: Self-pay | Admitting: Primary Care

## 2018-11-01 DIAGNOSIS — F17210 Nicotine dependence, cigarettes, uncomplicated: Secondary | ICD-10-CM

## 2018-11-01 DIAGNOSIS — I10 Essential (primary) hypertension: Secondary | ICD-10-CM

## 2018-11-01 DIAGNOSIS — E785 Hyperlipidemia, unspecified: Secondary | ICD-10-CM

## 2018-11-01 DIAGNOSIS — F17200 Nicotine dependence, unspecified, uncomplicated: Secondary | ICD-10-CM

## 2018-11-01 DIAGNOSIS — Z760 Encounter for issue of repeat prescription: Secondary | ICD-10-CM

## 2018-11-01 DIAGNOSIS — E119 Type 2 diabetes mellitus without complications: Secondary | ICD-10-CM

## 2018-11-01 MED ORDER — LISINOPRIL 5 MG PO TABS: 5.0000 mg | ORAL_TABLET | Freq: Every day | ORAL | 3 refills | Status: DC

## 2018-11-01 MED ORDER — AMLODIPINE BESY-BENAZEPRIL HCL 5-20 MG PO CAPS: 1.0000 | ORAL_CAPSULE | Freq: Every day | ORAL | 3 refills | Status: DC

## 2018-11-01 MED ORDER — ATORVASTATIN CALCIUM 40 MG PO TABS: 40.0000 mg | ORAL_TABLET | Freq: Every day | ORAL | 3 refills | Status: DC

## 2018-11-01 MED ORDER — METFORMIN HCL 850 MG PO TABS: 850.0000 mg | ORAL_TABLET | Freq: Every day | ORAL | 0 refills | Status: DC

## 2018-11-01 MED ORDER — SILDENAFIL CITRATE 20 MG PO TABS: 80.0000 mg | ORAL_TABLET | Freq: Every day | ORAL | 5 refills | Status: DC | PRN
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
## 2018-11-01 NOTE — Progress Notes (Signed)
Medication refill?already received HTN med but need the rest of his other medications.   Per pt his B/P this morning was 133/83 pulse 65 Glucose this morning was 123.

## 2019-03-23 ENCOUNTER — Other Ambulatory Visit: Payer: Self-pay | Admitting: Primary Care

## 2019-03-23 DIAGNOSIS — E119 Type 2 diabetes mellitus without complications: Secondary | ICD-10-CM

## 2019-03-23 MED ORDER — METFORMIN HCL 850 MG PO TABS: 850.0000 mg | ORAL_TABLET | Freq: Every day | ORAL | 0 refills | Status: DC

## 2019-03-23 NOTE — Telephone Encounter (Signed)
Please fill if appropriate.  

## 2019-04-01 ENCOUNTER — Other Ambulatory Visit: Payer: Self-pay | Admitting: Primary Care

## 2019-04-01 DIAGNOSIS — I10 Essential (primary) hypertension: Secondary | ICD-10-CM

## 2019-04-01 NOTE — Telephone Encounter (Signed)
FWD to PCP. Tempestt S Roberts, CMA  

## 2019-04-22 ENCOUNTER — Other Ambulatory Visit: Payer: Self-pay | Admitting: Family Medicine

## 2019-04-22 ENCOUNTER — Encounter: Payer: Self-pay | Admitting: Family Medicine

## 2019-04-22 DIAGNOSIS — E119 Type 2 diabetes mellitus without complications: Secondary | ICD-10-CM

## 2019-04-22 MED ORDER — METFORMIN HCL 850 MG PO TABS: 850.0000 mg | ORAL_TABLET | Freq: Every day | ORAL | 0 refills | Status: DC

## 2019-04-22 NOTE — Progress Notes (Signed)
Patient ID: Jose Deleon, male   DOB: 02-12-1961, 58 y.o.   MRN: [id number]   Patient sent My Chart message that he will be out metformin in 1 day. Will send in 30 day supply to give him a chance to schedule his office visit in follow-up of his medical issues

## 2019-05-02 ENCOUNTER — Other Ambulatory Visit: Payer: Self-pay | Admitting: Family Medicine

## 2019-05-02 DIAGNOSIS — I10 Essential (primary) hypertension: Secondary | ICD-10-CM

## 2019-05-02 MED ORDER — AMLODIPINE BESY-BENAZEPRIL HCL 5-20 MG PO CAPS: 1.0000 | ORAL_CAPSULE | Freq: Every day | ORAL | 0 refills | Status: DC

## 2019-05-02 NOTE — Telephone Encounter (Signed)
Fill if appropriate please

## 2019-05-19 ENCOUNTER — Other Ambulatory Visit: Payer: Self-pay | Admitting: Family Medicine

## 2019-05-19 DIAGNOSIS — E119 Type 2 diabetes mellitus without complications: Secondary | ICD-10-CM

## 2019-05-24 ENCOUNTER — Other Ambulatory Visit: Payer: Self-pay | Admitting: Primary Care

## 2019-05-24 DIAGNOSIS — E119 Type 2 diabetes mellitus without complications: Secondary | ICD-10-CM

## 2019-05-25 ENCOUNTER — Other Ambulatory Visit: Payer: Self-pay

## 2019-05-25 ENCOUNTER — Encounter: Payer: Self-pay | Admitting: Family Medicine

## 2019-05-25 ENCOUNTER — Ambulatory Visit: Payer: Self-pay | Attending: Family Medicine | Admitting: Family Medicine

## 2019-05-25 DIAGNOSIS — E785 Hyperlipidemia, unspecified: Secondary | ICD-10-CM

## 2019-05-25 DIAGNOSIS — M62830 Muscle spasm of back: Secondary | ICD-10-CM

## 2019-05-25 DIAGNOSIS — Z720 Tobacco use: Secondary | ICD-10-CM

## 2019-05-25 DIAGNOSIS — I10 Essential (primary) hypertension: Secondary | ICD-10-CM

## 2019-05-25 DIAGNOSIS — E119 Type 2 diabetes mellitus without complications: Secondary | ICD-10-CM

## 2019-05-25 DIAGNOSIS — K047 Periapical abscess without sinus: Secondary | ICD-10-CM

## 2019-05-25 DIAGNOSIS — Z79899 Other long term (current) drug therapy: Secondary | ICD-10-CM

## 2019-05-25 MED ORDER — AMLODIPINE BESY-BENAZEPRIL HCL 5-20 MG PO CAPS: 1.0000 | ORAL_CAPSULE | Freq: Every day | ORAL | 1 refills | Status: DC

## 2019-05-25 MED ORDER — METFORMIN HCL 850 MG PO TABS: 850.0000 mg | ORAL_TABLET | Freq: Every day | ORAL | 1 refills | Status: DC

## 2019-05-25 MED ORDER — ATORVASTATIN CALCIUM 40 MG PO TABS: 40.0000 mg | ORAL_TABLET | Freq: Every day | ORAL | 1 refills | Status: DC

## 2019-05-25 MED ORDER — CYCLOBENZAPRINE HCL 10 MG PO TABS: 10.0000 mg | ORAL_TABLET | Freq: Two times a day (BID) | ORAL | 1 refills | Status: DC | PRN

## 2019-05-25 MED ORDER — AMOXICILLIN 500 MG PO CAPS: 500.0000 mg | ORAL_CAPSULE | Freq: Two times a day (BID) | ORAL | 1 refills | Status: DC
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
## 2019-05-25 NOTE — Progress Notes (Signed)
Virtual Visit via Telephone Note  I connected with Jose Deleon on 05/25/19 at 10:10 AM EST by telephone and verified that I am speaking with the correct person using two identifiers.   I discussed the limitations, risks, security and privacy concerns of performing an evaluation and management service by telephone and the availability of in person appointments. I also discussed with the patient that there may be a patient responsible charge related to this service. The patient expressed understanding and agreed to proceed.  Patient Location: Home Provider Location: [facility] Others participating in call: call initiated by [place]ubia Lisbon, CMA who then transferred the call to me   History of Present Illness:       58 year old male seen in follow-up of and for continued medical management of chronic issues including type 2 diabetes, hypertension and hyperlipidemia.  Patient's last hemoglobin A1c done 03/16/2018 showed good control of blood sugars with a value of 6.6.  He denies any current issues with urinary frequency or increased thirst.  He has occasional random sharp shooting discomfort in his feet but not on a daily basis.  He continues to take Metformin as well as over-the-counter supplements such as cinnamon.  He monitors home blood sugars and highest has been 129 but generally blood sugars range between 118-123.         He remains compliant with blood pressure medication and states that his highest blood pressure has been 152/89 but generally blood pressure ranges between 1 24-1 30 over low 80s.  He denies any headaches or dizziness related to his blood pressure.  He is now also walking for exercise.  He is trying to avoid crowded areas due to the COVID-19 pandemic.  He reports that he continues to have occasional tooth pain as [facility] however students were not available due to the COVID-19 pandemic and dental clinic has been shut down for  now.  He would like to have a refill of antibiotics until he can be rescheduled with the dental clinic.         He continues to smoke but is down to 1 to 2 cigarettes/day with eventual plan to stop smoking.  He reports no shortness of breath or cough related to smoking at this time.  He continues to take atorvastatin for hyperlipidemia and he denies any current issues with increased muscle or joint pain related to the use of this medication.  Overall he feels well.  He denies any chest pain or palpitations, no shortness of breath or cough, no sore throat or difficulty swallowing, no urinary frequency or dysuria.  He does continue to have some issues with low back pain with muscle spasm in this area as well as in the shoulders however due to COVID-19 he has stopped going to the chiropractor and would like to have a prescription for Flexeril refilled.  He would like 90-day prescriptions to limit his exposure to anyone when he is picking up prescriptions.  He has had no issues with peripheral edema.  He reports no falls within the past 12 months.          Past Medical History:  Diagnosis Date  . FH: heart disease   . GERD (gastroesophageal reflux disease)   . High cholesterol   . History of gout    "reaction from the TB RX"  . Hypertension 2015  . TB (pulmonary tuberculosis) 1990  . Type II diabetes mellitus (HCC)  dx ~ 2015    Past Surgical History:  Procedure Laterality Date  . CARDIAC CATHETERIZATION N/A 03/13/2016   Procedure: Left Heart Cath and Coronary Angiography;  Surgeon: Peter M Martinique, MD;  Location: [facility];  Service: Cardiovascular;  Laterality: N/A;  . [place]   "lanced"    Family History  Problem Relation Age of Onset  . Hypertension Mother   . Heart attack Mother 37  . Hypertension Father 58  . Multiple sclerosis Father   . Stroke Maternal Grandmother     Social History   Tobacco Use  . Smoking status: Light Tobacco Smoker    Packs/day: 0.33      Years: 30.00    Pack years: 9.90    Types: Cigarettes    Last attempt to quit: 03/07/2016    Years since quitting: 3.2  . Smokeless tobacco: Never Used  . Tobacco comment: 1-2 cigs a day  Substance Use Topics  . Alcohol use: Yes    Alcohol/week: 4.0 standard drinks    Types: 4 Cans of beer per week  . Drug use: No     No Known Allergies     Observations/Objective: No vital signs or physical exam conducted as visit was done via telephone  Assessment and Plan: 1. Essential hypertension Refill provided of amlodipine-benazepril 5-20 mg daily.  Continue low-sodium diet.  Patient is asked to schedule fasting lab visit for lipid panel and will check electrolytes/renal function as part of comprehensive metabolic panel. - amLODipine-benazepril (LOTREL) 5-20 MG capsule; Take 1 capsule by mouth daily. To lower blood pressure  Dispense: 90 capsule; Refill: 1 - Lipid panel; Future  2. Type 2 diabetes mellitus without complication, without long-term current use of insulin ([place]) Patient has not had any recent follow-up of his diabetes with last hemoglobin A1c being 6.6 on 03/16/2018.  He has been asked to schedule lab visit in the next 3 to 4 weeks to have hemoglobin A1c, comprehensive metabolic panel and lipid panel in follow-up of his diabetes.  Refill provided of Metformin and continue low carbohydrate diet and exercise as tolerated. - atorvastatin (LIPITOR) 40 MG tablet; Take 1 tablet (40 mg total) by mouth daily. To lower cholesterol  Dispense: 90 tablet; Refill: 1 - metFORMIN (GLUCOPHAGE) 850 MG tablet; Take 1 tablet (850 mg total) by mouth daily with breakfast.  Dispense: 90 tablet; Refill: 1 - Comprehensive metabolic panel; Future - Lipid panel; Future - Hemoglobin A1c; Future  3. Hyperlipidemia, unspecified hyperlipidemia type Refill provided of atorvastatin 40 mg and patient is encouraged to follow a low fat diet.  He has not had lipid panel since 03/16/2018 at which time total  cholesterol was 215 and LDL of 111 with LDL goal of 70 or less as patient is diabetic.  He is to return to clinic for comprehensive metabolic panel in follow-up of statin use as well as fasting lipid panel. - atorvastatin (LIPITOR) 40 MG tablet; Take 1 tablet (40 mg total) by mouth daily. To lower cholesterol  Dispense: 90 tablet; Refill: 1 - Comprehensive metabolic panel; Future - Lipid panel; Future  4. Muscle spasm of back Refill provided per patient request of Flexeril to take as needed for muscle spasm related to chronic issues with low back pain. - cyclobenzaprine (FLEXERIL) 10 MG tablet; Take 1 tablet (10 mg total) by mouth 2 (two) times daily as needed for muscle spasms.  Dispense: 180 tablet; Refill: 1  5. Dental infection He reports current issues with dental infection  and prescription provided for amoxicillin 500 mg twice daily x10 days and patient is to schedule follow-up with dentist - amoxicillin (AMOXIL) 500 MG capsule; Take 1 capsule (500 mg total) by mouth 2 (two) times daily. For tooth infection  Dispense: 20 capsule; Refill: 1  6. Encounter for long-term (current) use of medications He has been asked to schedule follow-up appointment for lab visit for comprehensive metabolic panel in follow-up of long-term use of medications for treatment of hypertension, hyperlipidemia and diabetes. - Comprehensive metabolic panel; Future  Follow Up Instructions:Return in about 4 months (around 09/22/2019) for Chronic issues, sooner if needed; lab visit in the next 3 to 4 weeks.    I discussed the assessment and treatment plan with the patient. The patient was provided an opportunity to ask questions and all were answered. The patient agreed with the plan and demonstrated an understanding of the instructions.   The patient was advised to call back or seek an in-person evaluation if the symptoms worsen or if the condition fails to improve as anticipated.  I provided 18 minutes of  non-face-to-face time during this encounter.   Cain Saupe, MD

## 2019-05-27 ENCOUNTER — Other Ambulatory Visit: Payer: Self-pay

## 2019-06-01 ENCOUNTER — Ambulatory Visit: Payer: Self-pay | Admitting: Pharmacist

## 2019-06-09 ENCOUNTER — Ambulatory Visit: Payer: Self-pay | Admitting: Family Medicine

## 2019-06-09 ENCOUNTER — Ambulatory Visit: Payer: Self-pay | Admitting: Pharmacist

## 2019-08-17 ENCOUNTER — Encounter: Payer: Self-pay | Admitting: Family Medicine

## 2019-11-15 ENCOUNTER — Other Ambulatory Visit: Payer: Self-pay | Admitting: Family Medicine

## 2019-11-15 DIAGNOSIS — E119 Type 2 diabetes mellitus without complications: Secondary | ICD-10-CM

## 2019-11-15 DIAGNOSIS — I10 Essential (primary) hypertension: Secondary | ICD-10-CM

## 2019-12-01 ENCOUNTER — Telehealth: Payer: Self-pay

## 2019-12-01 ENCOUNTER — Other Ambulatory Visit: Payer: Self-pay | Admitting: Family Medicine

## 2019-12-01 DIAGNOSIS — I10 Essential (primary) hypertension: Secondary | ICD-10-CM

## 2019-12-01 DIAGNOSIS — E119 Type 2 diabetes mellitus without complications: Secondary | ICD-10-CM

## 2019-12-01 NOTE — Telephone Encounter (Signed)
Patient called to request a refill on their medication, they were instructed to hang up and contact their pharmacy directly to order their prescriptions even if they are out of refills.  1) Medication(s) Requested (by name): Amlodipine & Metformin  2) Pharmacy of Choice: [facility] on [address]  3) Special Requests: last 05/25/19 #90 One refill Metformin & Amlodopine Appt 01/16/20 at [facility].  Pt out of refills. PCP [facility]  Approved medications will be sent to the pharmacy, we will reach out if there is an issue.  Requests made after 3pm may not be addressed until the following business day!

## 2019-12-02 MED ORDER — METFORMIN HCL 850 MG PO TABS: 850.0000 mg | ORAL_TABLET | Freq: Every day | ORAL | 0 refills | Status: DC

## 2019-12-02 NOTE — Telephone Encounter (Signed)
Patient has an appointment scheduled with Dr. Earlene Plater in June. Should we forward these requests to her?

## 2019-12-02 NOTE — Telephone Encounter (Signed)
Rx sent 

## 2019-12-02 NOTE — Telephone Encounter (Signed)
Rxs need to be filled by current PCP. Unable to fill anything under Jose Deleon's name until he's seen her.

## 2020-01-02 ENCOUNTER — Other Ambulatory Visit: Payer: Self-pay | Admitting: Family Medicine

## 2020-01-02 DIAGNOSIS — E119 Type 2 diabetes mellitus without complications: Secondary | ICD-10-CM

## 2020-01-02 DIAGNOSIS — I10 Essential (primary) hypertension: Secondary | ICD-10-CM

## 2020-01-03 ENCOUNTER — Telehealth: Payer: Self-pay

## 2020-01-03 ENCOUNTER — Other Ambulatory Visit: Payer: Self-pay | Admitting: Internal Medicine

## 2020-01-03 DIAGNOSIS — E119 Type 2 diabetes mellitus without complications: Secondary | ICD-10-CM

## 2020-01-03 DIAGNOSIS — I10 Essential (primary) hypertension: Secondary | ICD-10-CM

## 2020-01-03 MED ORDER — AMLODIPINE BESY-BENAZEPRIL HCL 5-20 MG PO CAPS: 1.0000 | ORAL_CAPSULE | Freq: Every day | ORAL | 0 refills | Status: DC

## 2020-01-03 MED ORDER — METFORMIN HCL 850 MG PO TABS: 850.0000 mg | ORAL_TABLET | Freq: Every day | ORAL | 0 refills | Status: DC

## 2020-01-03 NOTE — Telephone Encounter (Signed)
Patient has not been seen in some time and has not had labs done since 2019. Will route request to Dr. Earlene Plater since she sees him soon.

## 2020-01-03 NOTE — Telephone Encounter (Signed)
1) Medication(s) Requested (by name): amlodipine & metformin  2) Pharmacy of Choice: [address]  3) Special Requests:  NP appt at [facility] provider on [address] on  01/16/20.  Pt needs more meds until he can be seen there.   Approved medications will be sent to the pharmacy, we will reach out if there is an issue.  Requests made after 3pm may not be addressed until the following business day!  If a patient is unsure of the name of the medication(s) please note and ask patient to call back when they are able to provide all info, do not send to responsible party until all information is available!

## 2020-01-16 ENCOUNTER — Encounter: Payer: Self-pay | Admitting: Internal Medicine

## 2020-01-16 ENCOUNTER — Telehealth (INDEPENDENT_AMBULATORY_CARE_PROVIDER_SITE_OTHER): Payer: Self-pay | Admitting: Internal Medicine

## 2020-01-16 DIAGNOSIS — I10 Essential (primary) hypertension: Secondary | ICD-10-CM

## 2020-01-16 DIAGNOSIS — M62830 Muscle spasm of back: Secondary | ICD-10-CM

## 2020-01-16 DIAGNOSIS — K047 Periapical abscess without sinus: Secondary | ICD-10-CM

## 2020-01-16 DIAGNOSIS — E119 Type 2 diabetes mellitus without complications: Secondary | ICD-10-CM

## 2020-01-16 DIAGNOSIS — G4700 Insomnia, unspecified: Secondary | ICD-10-CM

## 2020-01-16 DIAGNOSIS — N522 Drug-induced erectile dysfunction: Secondary | ICD-10-CM

## 2020-01-16 DIAGNOSIS — Z7689 Persons encountering health services in other specified circumstances: Secondary | ICD-10-CM

## 2020-01-16 DIAGNOSIS — E782 Mixed hyperlipidemia: Secondary | ICD-10-CM

## 2020-01-16 MED ORDER — AMLODIPINE BESY-BENAZEPRIL HCL 5-20 MG PO CAPS: 1.0000 | ORAL_CAPSULE | Freq: Every day | ORAL | 0 refills | Status: DC

## 2020-01-16 MED ORDER — ATORVASTATIN CALCIUM 40 MG PO TABS: 40.0000 mg | ORAL_TABLET | Freq: Every day | ORAL | 1 refills | Status: DC

## 2020-01-16 MED ORDER — CYCLOBENZAPRINE HCL 10 MG PO TABS: 10.0000 mg | ORAL_TABLET | Freq: Two times a day (BID) | ORAL | 1 refills | Status: DC | PRN

## 2020-01-16 MED ORDER — AMOXICILLIN 500 MG PO CAPS: 500.0000 mg | ORAL_CAPSULE | Freq: Two times a day (BID) | ORAL | 0 refills | Status: DC

## 2020-01-16 MED ORDER — SILDENAFIL CITRATE 20 MG PO TABS: 80.0000 mg | ORAL_TABLET | Freq: Every day | ORAL | 5 refills | Status: DC | PRN

## 2020-01-16 MED ORDER — METFORMIN HCL 850 MG PO TABS: 850.0000 mg | ORAL_TABLET | Freq: Every day | ORAL | 1 refills | Status: DC

## 2020-01-16 MED ORDER — TRAZODONE HCL 50 MG PO TABS: 25.0000 mg | ORAL_TABLET | Freq: Every evening | ORAL | 3 refills | Status: DC | PRN

## 2020-01-16 NOTE — Progress Notes (Signed)
Virtual Visit via Telephone Note  I connected with Jose Deleon, on 01/16/2020 at 1:57 PM by telephone due to the COVID-19 pandemic and verified that I am speaking with the correct person using two identifiers.   Consent: I discussed the limitations, risks, security and privacy concerns of performing an evaluation and management service by telephone and the availability of in person appointments. I also discussed with the patient that there may be a patient responsible charge related to this service. The patient expressed understanding and agreed to proceed.   Location of Patient: Home   Location of Provider: Clinic    Persons participating in Telemedicine visit: [place] [facility] Dr. Juleen China    History of Present Illness: Patient is establishing care, transitioning from [facility] with Dr. Chapman Fitch due to close distance from housing to our office.   PMH of HTN and DM. He is current smoker but cutting back. Down to 1 pack or less per week. Has had 6 cigarettes in the last 10 days. It is his goal to be totally quit by Sept.   Diabetes mellitus, Type 2 Disease Monitoring             Blood Sugar Ranges: Fasting - 125-135             Polyuria: no             Visual problems: no   Urine Microalbumin 2.2 (Feb 2016). On Ace inhibitor therapy.   Last A1C: 6.6 (Aug 2019)   Medications: Metformin 850 mg daily  Medication Compliance: yes  Medication Side Effects             Hypoglycemia: no  He has completely eliminated sugar from his house. He will only use artificial sweeteners.   Chronic HTN Disease Monitoring:  Home BP Monitoring - 134/92, 137/88. Last week was averaging mid 120s/mid to high 80s  Chest pain- no  Dyspnea- no Headache - no  Medications: Amlodipine 5 mg, Benazepril 20 mg  Compliance- yes Lightheadedness- no  Edema- no  Has eliminated salt from his diet. Has a seasoning that has no Na in it and its the only seasoning he uses now. His niece  is a Biomedical scientist on a cruise ship and has sent him some heart healthy recipes. He got an air fryer so that he is not frying anything in oil. Getting used to eating baked and grilled fish.   Hyperlipidemia: Was prescribed Lipitor but did not take because he hadn't been able to have a discussion with Dr. Chapman Fitch about the medication and he read about potential side effects and got nervous.   Insomnia: Has concerns about his inability to sleep through the night. Typically goes to bed around 10:30 and then will wake up around 1:30 and will be awake for a couple hours before falling back to sleep. Will be on computer or watching TV prior to bed. Tries to avoid napping in the afternoon but sometimes is unable to do so. Taking melatonin without much improvement. Does not drink alcohol or caffeine.        Past Medical History:  Diagnosis Date  . FH: heart disease   . GERD (gastroesophageal reflux disease)   . High cholesterol   . History of gout    "reaction from the TB RX"  . Hypertension 2015  . TB (pulmonary tuberculosis) 1990  . Type II diabetes mellitus ([place]) dx ~ 2015   No Known Allergies  Current Outpatient Medications on File Prior to Visit  Medication Sig Dispense Refill  . amLODipine-benazepril (LOTREL) 5-20 MG capsule Take 1 capsule by mouth daily. Must have office visit for refills 30 capsule 0  . Apple Cider Vinegar 300 MG TABS Take by mouth every morning.    Marland Kitchen aspirin 81 MG tablet Take 1 tablet (81 mg total) by mouth daily. 90 tablet 3  . Blood Glucose Monitoring Suppl (TRUERESULT BLOOD GLUCOSE) w/Device KIT 1 each by Does not apply route daily. 1 each 0  . Cinnamon 500 MG TABS Take by mouth.    . cyclobenzaprine (FLEXERIL) 10 MG tablet Take 1 tablet (10 mg total) by mouth 2 (two) times daily as needed for muscle spasms. 180 tablet 1  . Garlic 782 MG TABS Take by mouth every morning.    Marland Kitchen glucose blood (TRUETEST TEST) test strip 1 each by Other route 2 (two) times daily. Use as instructed  180 each 3  . metFORMIN (GLUCOPHAGE) 850 MG tablet Take 1 tablet (850 mg total) by mouth daily with breakfast. Must have office visit for refills 30 tablet 0  . sildenafil (REVATIO) 20 MG tablet Take 4-5 tablets (80-100 mg total) by mouth daily as needed. erection 30 tablet 5   No current facility-administered medications on file prior to visit.    Observations/Objective: NAD. Speaking clearly.  Work of breathing normal.  Alert and oriented. Mood appropriate.   Assessment and Plan: 1. Encounter to establish care Reviewed patient's PMH, social history, surgical history, and medications.  Is overdue for annual exam, screening blood work, and health maintenance topics. Have asked patient to return for visit to address these items.   2. Type 2 diabetes mellitus without complication, without long-term current use of insulin (HCC) Last A1c shows good glycemic control; however has been quite some time since monitored. Home glucoses reported have been stable and patient has made several diet changes. Continue Metformin and return for lab monitoring.  Counseled on Diabetic diet, my plate method, 423 minutes of moderate intensity exercise/week Blood sugar logs with fasting goals of 80-120 mg/dl, random of less than 180 and in the event of sugars less than 60 mg/dl or greater than 400 mg/dl encouraged to notify the clinic. Advised on the need for annual eye exams, annual foot exams, Pneumonia vaccine. - Hemoglobin A1c; Future - Microalbumin/Creatinine Ratio, Urine; Future - Comprehensive metabolic panel; Future - Ambulatory referral to Ophthalmology - atorvastatin (LIPITOR) 40 MG tablet; Take 1 tablet (40 mg total) by mouth daily. To lower cholesterol  Dispense: 90 tablet; Refill: 1 - metFORMIN (GLUCOPHAGE) 850 MG tablet; Take 1 tablet (850 mg total) by mouth daily with breakfast.  Dispense: 90 tablet; Refill: 1  3. Essential hypertension BPs at home sound well controlled. Asymptomatic. Continue  current regimen and will monitor at future in person visits.  Counseled on blood pressure goal of less than 130/80, low-sodium, DASH diet, medication compliance, 150 minutes of moderate intensity exercise per week. Discussed medication compliance, adverse effects. - CBC with Differential; Future - Comprehensive metabolic panel; Future - Lipid Panel; Future - amLODipine-benazepril (LOTREL) 5-20 MG capsule; Take 1 capsule by mouth daily.  Dispense: 90 capsule; Refill: 0  4. Mixed hyperlipidemia Discussed efficacy on statin therapy for primary prevention for heart attacks and strokes. Discussed potential side effects. Patient is agreeable to start. Continue ASA 81 mg. Return for lipid monitoring.  - Lipid Panel; Future - atorvastatin (LIPITOR) 40 MG tablet; Take 1 tablet (40 mg total) by mouth daily. To lower cholesterol  Dispense: 90 tablet; Refill: 1  5. Insomnia, unspecified type Discussed sleep hygiene and potential lifestyle changes that might improve his sleep. Has been using Melatonin without improvement so will prescribe Trazodone. Counseled on appropriate use and potential side effects.  - traZODone (DESYREL) 50 MG tablet; Take 0.5-1 tablets (25-50 mg total) by mouth at bedtime as needed for sleep.  Dispense: 30 tablet; Refill: 3  6. Dental infection Per patient, he is waiting to get in with [facility] for tooth extraction. Asks for Rx for Amoxicillin in interim as feels he has an infection.  - amoxicillin (AMOXIL) 500 MG capsule; Take 1 capsule (500 mg total) by mouth 2 (two) times daily. For tooth infection  Dispense: 20 capsule; Refill: 0  7. Muscle spasm of back - cyclobenzaprine (FLEXERIL) 10 MG tablet; Take 1 tablet (10 mg total) by mouth 2 (two) times daily as needed for muscle spasms.  Dispense: 180 tablet; Refill: 1  8. Drug-induced erectile dysfunction - sildenafil (REVATIO) 20 MG tablet; Take 4-5 tablets (80-100 mg total) by mouth daily as needed. erection   Dispense: 30 tablet; Refill: 5  Follow Up Instructions: Lab visit and annual exam    I discussed the assessment and treatment plan with the patient. The patient was provided an opportunity to ask questions and all were answered. The patient agreed with the plan and demonstrated an understanding of the instructions.   The patient was advised to call back or seek an in-person evaluation if the symptoms worsen or if the condition fails to improve as anticipated.     I provided 34 minutes total of non-face-to-face time during this encounter including median intraservice time, reviewing previous notes, investigations, ordering medications, medical decision making, coordinating care and patient verbalized understanding at the end of the visit.    Phill Myron, D.O. Primary Care at [facility]  01/16/2020, 1:57 PM

## 2020-03-23 ENCOUNTER — Telehealth: Payer: Self-pay | Admitting: Family Medicine

## 2020-03-23 ENCOUNTER — Other Ambulatory Visit: Payer: Self-pay

## 2020-03-23 ENCOUNTER — Other Ambulatory Visit (INDEPENDENT_AMBULATORY_CARE_PROVIDER_SITE_OTHER): Payer: Self-pay

## 2020-03-23 DIAGNOSIS — I10 Essential (primary) hypertension: Secondary | ICD-10-CM

## 2020-03-23 DIAGNOSIS — E119 Type 2 diabetes mellitus without complications: Secondary | ICD-10-CM

## 2020-03-23 DIAGNOSIS — E782 Mixed hyperlipidemia: Secondary | ICD-10-CM

## 2020-03-23 NOTE — Progress Notes (Signed)
Patient here for labs ordered during phone visit on 01/16/2020. KWalker, CMA.

## 2020-03-23 NOTE — Telephone Encounter (Signed)
Error

## 2020-03-24 LAB — COMPREHENSIVE METABOLIC PANEL
ALT: 37 IU/L (ref 0–44)
AST: 22 IU/L (ref 0–40)
Albumin/Globulin Ratio: 1.7 (ref 1.2–2.2)
Albumin: 4.7 g/dL (ref 3.8–4.9)
Alkaline Phosphatase: 109 IU/L (ref 48–121)
BUN/Creatinine Ratio: 18 (ref 10–24)
BUN: 16 mg/dL (ref 8–27)
Bilirubin Total: 0.3 mg/dL (ref 0.0–1.2)
CO2: 24 mmol/L (ref 20–29)
Calcium: 9.7 mg/dL (ref 8.6–10.2)
Chloride: 97 mmol/L (ref 96–106)
Creatinine, Ser: 0.88 mg/dL (ref 0.76–1.27)
GFR calc Af Amer: 108 mL/min/{1.73_m2} (ref >59)
GFR calc non Af Amer: 93 mL/min/{1.73_m2} (ref >59)
Globulin, Total: 2.7 g/dL (ref 1.5–4.5)
Glucose: 332 mg/dL — ABNORMAL HIGH (ref 65–99)
Potassium: 4.4 mmol/L (ref 3.5–5.2)
Sodium: 136 mmol/L (ref 134–144)
Total Protein: 7.4 g/dL (ref 6.0–8.5)

## 2020-03-24 LAB — CBC WITH DIFFERENTIAL/PLATELET
Basophils Absolute: 0.1 10*3/uL (ref 0.0–0.2)
Basos: 1 %
EOS (ABSOLUTE): 0.2 10*3/uL (ref 0.0–0.4)
Eos: 2 %
Hematocrit: 42.6 % (ref 37.5–51.0)
Hemoglobin: 14.8 g/dL (ref 13.0–17.7)
Immature Grans (Abs): 0 10*3/uL (ref 0.0–0.1)
Immature Granulocytes: 0 %
Lymphocytes Absolute: 2.6 10*3/uL (ref 0.7–3.1)
Lymphs: 35 %
MCH: 29.8 pg (ref 26.6–33.0)
MCHC: 34.7 g/dL (ref 31.5–35.7)
MCV: 86 fL (ref 79–97)
Monocytes Absolute: 0.8 10*3/uL (ref 0.1–0.9)
Monocytes: 11 %
Neutrophils Absolute: 3.7 10*3/uL (ref 1.4–7.0)
Neutrophils: 51 %
Platelets: 270 10*3/uL (ref 150–450)
RBC: 4.96 x10E6/uL (ref 4.14–5.80)
RDW: 12.3 % (ref 11.6–15.4)
WBC: 7.3 10*3/uL (ref 3.4–10.8)

## 2020-03-24 LAB — LIPID PANEL
Chol/HDL Ratio: 4.6 ratio (ref 0.0–5.0)
Cholesterol, Total: 255 mg/dL — ABNORMAL HIGH (ref 100–199)
HDL: 56 mg/dL (ref >39)
LDL Chol Calc (NIH): 134 mg/dL — ABNORMAL HIGH (ref 0–99)
Triglycerides: 360 mg/dL — ABNORMAL HIGH (ref 0–149)
VLDL Cholesterol Cal: 65 mg/dL — ABNORMAL HIGH (ref 5–40)

## 2020-03-24 LAB — HEMOGLOBIN A1C
Est. average glucose Bld gHb Est-mCnc: 240 mg/dL
Hgb A1c MFr Bld: 10 % — ABNORMAL HIGH (ref 4.8–5.6)

## 2020-03-24 LAB — MICROALBUMIN / CREATININE URINE RATIO
Creatinine, Urine: 93.7 mg/dL
Microalb/Creat Ratio: <3 mg/g creat (ref 0–29)
Microalbumin, Urine: <3 ug/mL

## 2020-03-27 ENCOUNTER — Other Ambulatory Visit: Payer: Self-pay | Admitting: Internal Medicine

## 2020-03-27 DIAGNOSIS — E119 Type 2 diabetes mellitus without complications: Secondary | ICD-10-CM

## 2020-03-27 MED ORDER — METFORMIN HCL 1000 MG PO TABS: 1000.0000 mg | ORAL_TABLET | Freq: Two times a day (BID) | ORAL | 1 refills | Status: DC

## 2020-03-28 ENCOUNTER — Other Ambulatory Visit: Payer: Self-pay | Admitting: Internal Medicine

## 2020-03-28 ENCOUNTER — Telehealth: Payer: Self-pay

## 2020-03-28 DIAGNOSIS — E782 Mixed hyperlipidemia: Secondary | ICD-10-CM

## 2020-03-28 DIAGNOSIS — E119 Type 2 diabetes mellitus without complications: Secondary | ICD-10-CM

## 2020-03-28 MED ORDER — ATORVASTATIN CALCIUM 80 MG PO TABS: 80.0000 mg | ORAL_TABLET | Freq: Every day | ORAL | 1 refills | Status: DC

## 2020-03-28 NOTE — Telephone Encounter (Signed)
Per patient, he has been compliant with Lipitor. Please send increased dose to pharmacy.

## 2020-03-28 NOTE — Telephone Encounter (Signed)
Lipitor 80 mg sent in.   Marcy Siren, D.O. Primary Care at [facility]  03/28/2020, 7:57 PM

## 2020-03-29 NOTE — Progress Notes (Unsigned)
A1C =  Check Clinic BG How long had DM? 2015? Review medications and adherence (timing of meds, etc.)  Ate or drank anything prior to visit today? At home BGs?  Marland Kitchen Highs . Lows  Hyperglycemia sx (nocturia, neuropathy, visual changes, foot exams) Hypoglycemia symptoms (dizziness, shaky, sweating, hungry, confusion) Diet Exercise      S:    PCP: Dr. Jillyn Hidden  Patient arrives ***.  Presents for diabetes evaluation, education, and management Patient was referred and last seen by Dr. Earlene Plater on 01/16/20.    Patient reports Diabetes was diagnosed in ***.   Family/Social History:  - FHx: hypertension, MI, stroke - Tobacco: smokes 1-2 cigarettes per day   Insurance coverage/medication affordability: self-pay  Medication adherence reported *** .   Current diabetes medications include: - Metformin 1000 mg BID Current hypertension medications include: amlodipine-benazepril 5-20 mg daily Current hyperlipidemia medications include: atorvastatin 80 mg daily  Patient {Actions; denies-reports:120008} hypoglycemic events.  Patient reported dietary habits: Eats *** meals/day Breakfast:*** Lunch:*** Dinner:*** Snacks:*** Drinks:***  Patient-reported exercise habits: ***   Patient {Actions; denies-reports:120008} nocturia (nighttime urination).  Patient {Actions; denies-reports:120008} neuropathy (nerve pain). Patient {Actions; denies-reports:120008} visual changes. Patient {Actions; denies-reports:120008} self foot exams.     O:  POCT BG: ***   Lab Results  Component Value Date   HGBA1C 10.0 (H) 03/23/2020   There were no vitals filed for this visit.  Lipid Panel     Component Value Date/Time   CHOL 255 (H) 03/23/2020 1043   TRIG 360 (H) 03/23/2020 1043   HDL 56 03/23/2020 1043   CHOLHDL 4.6 03/23/2020 1043   CHOLHDL 3.3 03/11/2016 1954   VLDL 18 03/11/2016 1954   LDLCALC 134 (H) 03/23/2020 1043    Home fasting blood sugars: ***  2 hour post-meal/random blood  sugars: ***.   Clinical Atherosclerotic Cardiovascular Disease (ASCVD): {YES/NO:21197} The ASCVD Risk score Denman George DC Jr., et al., 2013) failed to calculate for the following reasons:   The systolic blood pressure is missing    A/P: Diabetes longstanding*** currently uncontrolled with most recent A1c of 10.0 on 03/23/20. Patient is *** able to verbalize appropriate hypoglycemia management plan. Medication adherence appears ***. Control is suboptimal due to ***. -{Meds adjust:18428} basal insulin *** (insulin ***). Patient will continue to titrate 1 unit every *** days if fasting blood sugar > 100mg /dl until fasting blood sugars reach goal or next visit.  -{Meds adjust:18428}  rapid insulin *** (insulin ***) to ***.  -{Meds adjust:18428} GLP-1 *** (generic name***) to ***.  -{Meds adjust:18428} SGLT2-I *** (generic name***) to ***. Counseled on sick day rules for ***. -Extensively discussed pathophysiology of diabetes, recommended lifestyle interventions, dietary effects on blood sugar control -Counseled on s/sx of and management of hypoglycemia -Next A1C anticipated ***.   ASCVD risk - primary***secondary prevention in patient with diabetes. Last LDL {Is/is not:9024} controlled. ASCVD risk score {Is/is not:9024} >20%  - {Desc; low/moderate/high:110033} intensity statin indicated. Aspirin {Is/is not:9024} indicated.  -{Meds adjust:18428} aspirin *** mg  -{Meds adjust:18428} ***statin *** mg.   Hypertension longstanding*** currently ***.  Blood pressure goal = *** mmHg. Medication adherence ***.  Blood pressure control is suboptimal due to ***. -***  Written patient instructions provided.  Total time in face to face counseling *** minutes.   Follow up Pharmacist/PCP*** Clinic Visit in ***.

## 2020-03-30 ENCOUNTER — Ambulatory Visit: Payer: Self-pay | Admitting: Pharmacist

## 2020-05-01 ENCOUNTER — Other Ambulatory Visit: Payer: Self-pay

## 2020-05-01 DIAGNOSIS — I10 Essential (primary) hypertension: Secondary | ICD-10-CM

## 2020-05-02 MED ORDER — AMLODIPINE BESY-BENAZEPRIL HCL 5-20 MG PO CAPS: 1.0000 | ORAL_CAPSULE | Freq: Every day | ORAL | 0 refills | Status: DC

## 2020-05-08 NOTE — Progress Notes (Unsigned)
S:     PCP: Dr. Earlene Plater PMH: HTN, DM, gout, GERD  Patient arrives in good spirits. Presents for diabetes evaluation, education, and management. Patient was referred and last seen by Primary Care Provider on 01/16/20. At that visit, patient report fasting sugars between 125-135 while on Metformin 850 mg daily. No medication changes were made. Most recently on 03/27/20, metformin was increased to 1000 mg BID by Dr. Earlene Plater.  Patient reports Diabetes was diagnosed in ***.   Today, patient reports ***  03/23/20  BG on 03/23/20 - 332  Scr 0.88  LDL 134, TG 360, TC 255 --> atorvastatin increased to 80 mg  UACR <3 - on RASS therapy anyways  A1C = 10% previously 6.6% in 2019    Check Clinic BG Review medications and adherence (timing of meds, etc.) - metformin - GI issues (N/V, diarrhea)? Ate or drank anything prior to visit today? At home BGs?  Marland Kitchen Highs . Lows  Hyperglycemia sx (nocturia, neuropathy, visual changes, foot exams) Hypoglycemia symptoms (dizziness, shaky, sweating, hungry, confusion) Diet Exercise  DM plan -GLP  Health maintenance -Flu -Pneumovax 23 -TDAP -shingles    Family/Social History: -Fhx: HTN (mother, father); heart attack (mother); stroke (grandmother) -Tobacco use: light tobacco smoker - 0.33 packs/day for 30 years now smoking 1-2 cigs/day  Insurance coverage/medication affordability: self-pay  Medication adherence reported *** .   Current diabetes medications include: metformin 1000 mg ID,  Current hypertension medications include: amlodipine-benazepril 5-20 mg daily Current hyperlipidemia medications include: atorvastatin 80 mg daily  Patient {Actions; denies-reports:120008} hypoglycemic events.  Patient reported dietary habits: Eats *** meals/day Breakfast:*** Lunch:*** Dinner:*** Snacks:*** Drinks:***  Patient-reported exercise habits: ***   Patient {Actions; denies-reports:120008} nocturia (nighttime urination).  Patient  {Actions; denies-reports:120008} neuropathy (nerve pain). Patient {Actions; denies-reports:120008} visual changes. Patient {Actions; denies-reports:120008} self foot exams.     O:  Physical Exam   ROS   Lab Results  Component Value Date   HGBA1C 10.0 (H) 03/23/2020   There were no vitals filed for this visit.  Lipid Panel     Component Value Date/Time   CHOL 255 (H) 03/23/2020 1043   TRIG 360 (H) 03/23/2020 1043   HDL 56 03/23/2020 1043   CHOLHDL 4.6 03/23/2020 1043   CHOLHDL 3.3 03/11/2016 1954   VLDL 18 03/11/2016 1954   LDLCALC 134 (H) 03/23/2020 1043    Home fasting blood sugars: ***  2 hour post-meal/random blood sugars: ***.   Clinical Atherosclerotic Cardiovascular Disease (ASCVD): {YES/NO:21197} The ASCVD Risk score Denman George DC Jr., et al., 2013) failed to calculate for the following reasons:   The systolic blood pressure is missing    A/P: Diabetes longstanding*** currently ***. Patient is *** able to verbalize appropriate hypoglycemia management plan. Medication adherence appears ***. Control is suboptimal due to ***. -{Meds adjust:18428} basal insulin *** (insulin ***). Patient will continue to titrate 1 unit every *** days if fasting blood sugar > 100mg /dl until fasting blood sugars reach goal or next visit.  -{Meds adjust:18428}  rapid insulin *** (insulin ***) to ***.  -{Meds adjust:18428} GLP-1 *** (generic name***) to ***.  -{Meds adjust:18428} SGLT2-I *** (generic name***) to ***. Counseled on sick day rules for ***. -Extensively discussed pathophysiology of diabetes, recommended lifestyle interventions, dietary effects on blood sugar control -Counseled on s/sx of and management of hypoglycemia -Next A1C anticipated ***.   ASCVD risk - primary***secondary prevention in patient with diabetes. Last LDL {Is/is not:9024} controlled. ASCVD risk score {Is/is not:9024} >20%  - {Desc; low/moderate/high:110033} intensity  statin indicated. Aspirin {Is/is not:9024}  indicated.  -{Meds adjust:18428} aspirin *** mg  -{Meds adjust:18428} ***statin *** mg.   Hypertension longstanding*** currently ***.  Blood pressure goal = *** mmHg. Medication adherence ***.  Blood pressure control is suboptimal due to ***. -***  Written patient instructions provided.  Total time in face to face counseling *** minutes.   Follow up Pharmacist/PCP*** Clinic Visit in ***.   Fabio Neighbors, PharmD PGY2 Ambulatory Care Resident [facility]  Pharmacy

## 2020-05-09 ENCOUNTER — Ambulatory Visit: Payer: Self-pay | Admitting: Pharmacist

## 2020-05-17 ENCOUNTER — Ambulatory Visit: Payer: Self-pay | Admitting: Pharmacist

## 2020-06-26 ENCOUNTER — Encounter: Payer: Self-pay | Admitting: Internal Medicine

## 2020-06-26 ENCOUNTER — Telehealth (INDEPENDENT_AMBULATORY_CARE_PROVIDER_SITE_OTHER): Payer: Self-pay | Admitting: Internal Medicine

## 2020-06-26 DIAGNOSIS — E119 Type 2 diabetes mellitus without complications: Secondary | ICD-10-CM

## 2020-06-26 DIAGNOSIS — E782 Mixed hyperlipidemia: Secondary | ICD-10-CM

## 2020-06-26 DIAGNOSIS — I10 Essential (primary) hypertension: Secondary | ICD-10-CM

## 2020-06-26 MED ORDER — METFORMIN HCL 1000 MG PO TABS: 1000.0000 mg | ORAL_TABLET | Freq: Two times a day (BID) | ORAL | 2 refills | Status: DC

## 2020-06-26 MED ORDER — AMLODIPINE BESY-BENAZEPRIL HCL 5-20 MG PO CAPS: 1.0000 | ORAL_CAPSULE | Freq: Every day | ORAL | 1 refills | Status: DC

## 2020-06-26 NOTE — Progress Notes (Signed)
Virtual Visit via Telephone Note  I connected with Jose Deleon, on 06/26/2020 at 9:11 AM by telephone due to the COVID-19 pandemic and verified that I am speaking with the correct person using two identifiers.   Consent: I discussed the limitations, risks, security and privacy concerns of performing an evaluation and management service by telephone and the availability of in person appointments. I also discussed with the patient that there may be a patient responsible charge related to this service. The patient expressed understanding and agreed to proceed.   Location of Patient: Home   Location of Provider: Clinic    Persons participating in Telemedicine visit: Jose Deleon [facility] Dr. Juleen China      History of Present Illness: Patient has a visit to f/u on chronic medical conditions.   Diabetes mellitus, Type 2 Disease Monitoring             Blood Sugar Ranges: Fasting - 144 this AM, 130-180s typically              Polyuria: no              Visual problems: no   Urine Microalbumin <3 (Sept 2021)--on Ace inhibitor   Last A1C: 10 (Sept 2021)   Medications: Metformin 1000 mg BID  Medication Compliance: yes  Medication Side Effects             Hypoglycemia: no   Chronic HTN Disease Monitoring:  Home BP Monitoring - 160/80s reports when not walking consistently but says its much better when he's adherent to a walking regimen  Chest pain- no  Dyspnea- no Headache - no  Medications: Amlodipine 5 mg, Benazepril 20 mg  Compliance- yes Lightheadedness- no  Edema- no     Past Medical History:  Diagnosis Date  . FH: heart disease   . GERD (gastroesophageal reflux disease)   . High cholesterol   . History of gout    "reaction from the TB RX"  . Hypertension 2015  . TB (pulmonary tuberculosis) 1990  . Type II diabetes mellitus ([place]) dx ~ 2015   No Known Allergies  Current Outpatient Medications on File Prior to Visit  Medication Sig  Dispense Refill  . amLODipine-benazepril (LOTREL) 5-20 MG capsule Take 1 capsule by mouth daily. 90 capsule 0  . Apple Cider Vinegar 300 MG TABS Take by mouth every morning.    Marland Kitchen aspirin 81 MG tablet Take 1 tablet (81 mg total) by mouth daily. 90 tablet 3  . atorvastatin (LIPITOR) 80 MG tablet Take 1 tablet (80 mg total) by mouth daily. To lower cholesterol 90 tablet 1  . Blood Glucose Monitoring Suppl (TRUERESULT BLOOD GLUCOSE) w/Device KIT 1 each by Does not apply route daily. 1 each 0  . Cinnamon 500 MG TABS Take by mouth.    . cyclobenzaprine (FLEXERIL) 10 MG tablet Take 1 tablet (10 mg total) by mouth 2 (two) times daily as needed for muscle spasms. 180 tablet 1  . Garlic 382 MG TABS Take by mouth every morning.    Marland Kitchen glucose blood (TRUETEST TEST) test strip 1 each by Other route 2 (two) times daily. Use as instructed 180 each 3  . metFORMIN (GLUCOPHAGE) 1000 MG tablet Take 1 tablet (1,000 mg total) by mouth 2 (two) times daily with a meal. 60 tablet 1  . sildenafil (REVATIO) 20 MG tablet Take 4-5 tablets (80-100 mg total) by mouth daily as needed. erection 30 tablet 5  . traZODone (DESYREL) 50 MG tablet Take 0.5-1  tablets (25-50 mg total) by mouth at bedtime as needed for sleep. 30 tablet 3   No current facility-administered medications on file prior to visit.    Observations/Objective: NAD. Speaking clearly.  Work of breathing normal.  Alert and oriented. Mood appropriate.   Assessment and Plan: 1. Type 2 diabetes mellitus without complication, without long-term current use of insulin (HCC) Last A1c quite elevated. However, patient now reporting fasting CBGs that are well controlled. Continue Metformin. Return for A1c monitoring to help guide therapy.  - Hemoglobin A1c; Future - metFORMIN (GLUCOPHAGE) 1000 MG tablet; Take 1 tablet (1,000 mg total) by mouth 2 (two) times daily with a meal.  Dispense: 60 tablet; Refill: 2  2. Essential hypertension Discussed with patient that BP is  above goal. He is very resistant to any change in medications. Continue current regimen and encouraged exercise and DASH diet. Will need to monitor closely. Asymptomatic.  - amLODipine-benazepril (LOTREL) 5-20 MG capsule; Take 1 capsule by mouth daily.  Dispense: 90 capsule; Refill: 1  3. Mixed hyperlipidemia Continue Lipitor.    Follow Up Instructions: Lab f/u    I discussed the assessment and treatment plan with the patient. The patient was provided an opportunity to ask questions and all were answered. The patient agreed with the plan and demonstrated an understanding of the instructions.   The patient was advised to call back or seek an in-person evaluation if the symptoms worsen or if the condition fails to improve as anticipated.     I provided 16 minutes total of non-face-to-face time during this encounter including median intraservice time, reviewing previous notes, investigations, ordering medications, medical decision making, coordinating care and patient verbalized understanding at the end of the visit.    Phill Myron, D.O. Primary Care at [facility]  06/26/2020, 9:11 AM

## 2020-07-05 ENCOUNTER — Ambulatory Visit: Payer: Self-pay | Admitting: Pharmacist

## 2020-07-05 ENCOUNTER — Telehealth: Payer: Self-pay | Admitting: Internal Medicine

## 2020-07-05 NOTE — Telephone Encounter (Signed)
Called patient and cancelled appointment for this afternoon per CRM. Patient was advised to call back [phone]. Because of his known exposure to COVID, when rescheduling please ask screening questions.

## 2020-07-24 ENCOUNTER — Other Ambulatory Visit: Payer: Self-pay | Admitting: Internal Medicine

## 2020-07-24 DIAGNOSIS — I10 Essential (primary) hypertension: Secondary | ICD-10-CM

## 2020-11-19 ENCOUNTER — Other Ambulatory Visit: Payer: Self-pay | Admitting: Internal Medicine

## 2020-11-19 DIAGNOSIS — E119 Type 2 diabetes mellitus without complications: Secondary | ICD-10-CM

## 2020-11-21 ENCOUNTER — Ambulatory Visit (INDEPENDENT_AMBULATORY_CARE_PROVIDER_SITE_OTHER): Payer: Self-pay | Admitting: Internal Medicine

## 2020-11-21 ENCOUNTER — Encounter: Payer: Self-pay | Admitting: Internal Medicine

## 2020-11-21 ENCOUNTER — Other Ambulatory Visit: Payer: Self-pay

## 2020-11-21 VITALS — BP 138/87 | HR 79 | Resp 16 | Ht 74.5 in | Wt 198.0 lb

## 2020-11-21 DIAGNOSIS — N522 Drug-induced erectile dysfunction: Secondary | ICD-10-CM

## 2020-11-21 DIAGNOSIS — E119 Type 2 diabetes mellitus without complications: Secondary | ICD-10-CM

## 2020-11-21 DIAGNOSIS — I10 Essential (primary) hypertension: Secondary | ICD-10-CM

## 2020-11-21 LAB — POCT GLYCOSYLATED HEMOGLOBIN (HGB A1C): HbA1c, POC (controlled diabetic range): 8 % — AB (ref 0.0–7.0)

## 2020-11-21 LAB — GLUCOSE, POCT (MANUAL RESULT ENTRY): POC Glucose: 145 mg/dl — AB (ref 70–99)

## 2020-11-21 MED ORDER — METFORMIN HCL 1000 MG PO TABS: 1000.0000 mg | ORAL_TABLET | Freq: Two times a day (BID) | ORAL | 2 refills | Status: DC

## 2020-11-21 MED ORDER — SILDENAFIL CITRATE 100 MG PO TABS: 50.0000 mg | ORAL_TABLET | Freq: Every day | ORAL | 5 refills | Status: DC | PRN

## 2020-11-21 NOTE — Progress Notes (Signed)
Subjective:    Jose Deleon - 60 y.o. 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 Reports he has had a time adhering to an exercise regimen. He has just stated walking with a friend. Plans to walk 3 miles this evening. Walked several miles over the weekend as well.   Diabetes mellitus, Type 2 Disease Monitoring             Blood Sugar Ranges: Fasting - 100s-140s             Polyuria: no             Visual problems: no   Urine Microalbumin <3 Sept 2021 On Benazepril   Last A1C: 10 (Sept 2021)   Medications: Metformin 1000 mg BID  Medication Compliance: yes  Medication Side Effects             Hypoglycemia: no   Chronic HTN Disease Monitoring:  Home BP Monitoring - 140s/80s on average, doesn't get much higher than this  Chest pain- no  Dyspnea- no Headache - no  Medications: Amlodipine 5 mg, Benazepril 20 mg  Compliance- yes Lightheadedness- no  Edema- no  Reports he has gotten rid of all Na seasonings in his house. Has switched to air fryer in place of any oil fried food.    Health Maintenance:  Health Maintenance Due  Topic Date Due  . COVID-19 Vaccine (1) Never done  . OPHTHALMOLOGY EXAM  Never done  . COLONOSCOPY (Pts 45-109yrs Insurance coverage will need to be confirmed)  Never done  . FOOT EXAM  06/23/2017    -  reports that he has been smoking cigarettes. He has a 9.90 pack-year smoking history. He has never used smokeless tobacco. - Review of Systems: Per HPI. - Past Medical History: Patient Active Problem List   Diagnosis Date Noted  . STRONG FH: heart disease   . Insomnia 10/08/2015  . 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 Dental abscess 09/08/2014  . Current smoker 09/08/2014  . Diabetes mellitus without complication (HCC)   . Hypertension    - Medications: reviewed and updated   Objective:   Physical Exam BP 138/87   Pulse 79   Resp 16   Ht  6' 2.5" (1.892 m)   Wt 198 lb (89.8 kg)   SpO2 94%   BMI 25.08 kg/m  Physical Exam Constitutional:      General: He is not in acute distress.    Appearance: He is not diaphoretic.  HENT:     Head: Normocephalic and atraumatic.  Eyes:     Conjunctiva/sclera: Conjunctivae normal.  Cardiovascular:     Rate and Rhythm: Normal rate and regular rhythm.     Heart sounds: Normal heart sounds. No murmur heard.   Pulmonary:     Effort: Pulmonary effort is normal. No respiratory distress.     Breath sounds: Normal breath sounds.  Musculoskeletal:        General: Normal range of motion.  Skin:    General: Skin is warm and dry.  Neurological:     Mental Status: He is alert and oriented to person, place, and time.  Psychiatric:        Mood and Affect: Affect normal.        Judgment: Judgment normal.        Assessment & Plan:    1. Type 2 diabetes mellitus without complication, without long-term current use of  insulin (HCC) A1c improved from 10 to 8 with medication compliance. Patient aware that he needs to adhere to exercise and diet better. Will continue medication regimen as is and work on lifestyle changes. If still above goal in 3 months, will need to add additional medication.  - POCT glucose (manual entry) - POCT glycosylated hemoglobin (Hb A1C) - metFORMIN (GLUCOPHAGE) 1000 MG tablet; Take 1 tablet (1,000 mg total) by mouth 2 (two) times daily with a meal.  Dispense: 60 tablet; Refill: 2  2. Primary hypertension BP at/near goal. Asymptomatic. Continue current regimen.   3. Drug-induced erectile dysfunction - sildenafil (VIAGRA) 100 MG tablet; Take 0.5-1 tablets (50-100 mg total) by mouth daily as needed for erectile dysfunction.  Dispense: 30 tablet; Refill: 5   Marcy Siren, D.O. 11/21/2020, 4:53 PM Primary Care at [facility]

## 2021-01-24 ENCOUNTER — Other Ambulatory Visit: Payer: Self-pay

## 2021-01-24 DIAGNOSIS — I10 Essential (primary) hypertension: Secondary | ICD-10-CM

## 2021-01-24 MED ORDER — AMLODIPINE BESY-BENAZEPRIL HCL 5-20 MG PO CAPS: 1.0000 | ORAL_CAPSULE | Freq: Every day | ORAL | 0 refills | Status: DC

## 2021-01-24 NOTE — Progress Notes (Signed)
Per patient request refilled for Amlodipine-benazepril(Lotrel) 90 day supply. Please keep scheduled appointment for additional refills

## 2021-03-02 NOTE — Progress Notes (Signed)
Patient prefers to call back for rescheduling.

## 2021-03-04 ENCOUNTER — Encounter: Payer: Self-pay | Admitting: Family

## 2021-03-04 ENCOUNTER — Other Ambulatory Visit: Payer: Self-pay

## 2021-03-04 DIAGNOSIS — E119 Type 2 diabetes mellitus without complications: Secondary | ICD-10-CM

## 2021-03-04 DIAGNOSIS — I10 Essential (primary) hypertension: Secondary | ICD-10-CM

## 2021-03-09 ENCOUNTER — Other Ambulatory Visit: Payer: Self-pay | Admitting: Family

## 2021-03-09 DIAGNOSIS — I10 Essential (primary) hypertension: Secondary | ICD-10-CM

## 2021-03-12 ENCOUNTER — Ambulatory Visit: Payer: Self-pay | Admitting: Family Medicine

## 2021-03-19 ENCOUNTER — Telehealth: Payer: Self-pay | Admitting: Physician Assistant

## 2021-03-20 ENCOUNTER — Telehealth: Payer: Self-pay | Admitting: Nurse Practitioner

## 2021-03-20 DIAGNOSIS — E119 Type 2 diabetes mellitus without complications: Secondary | ICD-10-CM

## 2021-03-20 DIAGNOSIS — I10 Essential (primary) hypertension: Secondary | ICD-10-CM

## 2021-03-20 MED ORDER — METFORMIN HCL 1000 MG PO TABS: 1000.0000 mg | ORAL_TABLET | Freq: Two times a day (BID) | ORAL | 0 refills | Status: DC

## 2021-03-20 MED ORDER — AMLODIPINE BESY-BENAZEPRIL HCL 5-20 MG PO CAPS: 1.0000 | ORAL_CAPSULE | Freq: Every day | ORAL | 0 refills | Status: DC

## 2021-03-20 NOTE — Progress Notes (Signed)
Virtual Visit Consent   Jose Deleon, you are scheduled for a virtual visit with Mary-Margaret Hassell Done, [place], a [facility] provider, today.     Just as with appointments in the office, your consent must be obtained to participate.  Your consent will be active for this visit and any virtual visit you may have with one of our providers in the next 365 days.     If you have a MyChart account, a copy of this consent can be sent to you electronically.  All virtual visits are billed to your insurance company just like a traditional visit in the office.    As this is a virtual visit, video technology does not allow for your provider to perform a traditional examination.  This may limit your provider's ability to fully assess your condition.  If your provider identifies any concerns that need to be evaluated in person or the need to arrange testing (such as labs, EKG, etc.), we will make arrangements to do so.     Although advances in technology are sophisticated, we cannot ensure that it will always work on either your end or our end.  If the connection with a video visit is poor, the visit may have to be switched to a telephone visit.  With either a video or telephone visit, we are not always able to ensure that we have a secure connection.     I need to obtain your verbal consent now.   Are you willing to proceed with your visit today? YES   Ledger Heindl has provided verbal consent on 03/20/2021 for a virtual visit (video or telephone). Lost connection and had to finish on telephone.   Mary-Margaret Hassell Done, FNP   Date: 03/20/2021 12:50 PM   Virtual Visit via Video Note   I, Mary-Margaret Hassell Done, connected with Daelon Dunivan ([id number], 05-23-1961) on 03/20/21 at  1:00 PM EDT by a video-enabled telemedicine application and verified that I am speaking with the correct person using two identifiers.  Location: Patient: Virtual Visit Location Patient: Home Provider: Virtual Visit Location  Provider: Mobile   I discussed the limitations of evaluation and management by telemedicine and the availability of in person appointments. The patient expressed understanding and agreed to proceed.    History of Present Illness: Jose Deleon is a 60 y.o. who identifies as a male who was assigned male at birth, and is being seen today for medication refill.  HPI: Patient schedules appointment to get refill on meds. He had an appointment yesterday but was not able to see provider. He thought this appointment was with a provider in that office. He says he is almost out of metformin and his lotrel. Says he is doing well with no complaints. 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 To lower cholesterol, Disp: 90 tablet, Rfl: 1   Blood Glucose Monitoring Suppl (TRUERESULT BLOOD GLUCOSE) w/Device KIT, 1 each by Does not apply route daily., Disp: 1 each, Rfl: 0   Cinnamon 500 MG TABS, Take by mouth., Disp: , Rfl:    cyclobenzaprine (FLEXERIL) 10 MG tablet, Take 1 tablet (10 mg total) by mouth 2 (two) times daily as needed for muscle spasms., Disp: 180 tablet, Rfl: 1    Garlic 220 MG TABS, Take by mouth every morning., Disp: , Rfl:    glucose blood (TRUETEST TEST) test strip, 1 each by Other route 2 (two) times daily. Use as instructed, Disp: 180 each, Rfl: 3   metFORMIN (GLUCOPHAGE) 1000 MG tablet, Take 1 tablet (1,000 mg total) by mouth 2 (two) times daily with a meal., Disp: 60 tablet, Rfl: 2   sildenafil (VIAGRA) 100 MG tablet, Take 0.5-1 tablets (50-100 mg total) by mouth daily as needed for erectile dysfunction., Disp: 30 tablet, Rfl: 5   traZODone (DESYREL) 50 MG tablet, Take 0.5-1 tablets (25-50 mg total) by mouth at bedtime as needed for sleep., Disp: 30 tablet, Rfl: 3  Observations/Objective: Patient is well-developed, well-nourished in no acute distress.  Resting comfortably at home.  Head is normocephalic, atraumatic.  No labored breathing.  Speech is clear and coherent with logical content.  Patient is alert and oriented at baseline.    Assessment and Plan:  Keller Bounds in today with chief complaint of Medication Refill   1. Type 2 diabetes mellitus without complication, without long-term current use of insulin (HCC) Stricter carb counting - metFORMIN (GLUCOPHAGE) 1000 MG tablet; Take 1 tablet (1,000 mg total) by mouth 2 (two) times daily with a meal.  Dispense: 180 tablet; Refill: 0  2. Primary hypertension Low sodium diet - amLODipine-benazepril (LOTREL) 5-20 MG capsule; Take 1 capsule by mouth daily.  Dispense: 90 capsule; Refill: 0  Patient was informed can only give 1 refill of meds. He will have to make an appointment with PCP for recheck and blood work.      Follow Up Instructions: I discussed the assessment and treatment plan with the patient. The patient was provided an opportunity to ask questions and all were answered. The patient agreed with the plan and demonstrated an understanding of the instructions.  A copy of instructions were sent to the patient via MyChart.  The patient was advised to call back or seek an  in-person evaluation if the symptoms worsen or if the condition fails to improve as anticipated.  Time:  I spent 12 minutes with the patient via telehealth technology discussing the above problems/concerns.    Mary-Margaret Hassell Done, FNP

## 2021-04-26 ENCOUNTER — Other Ambulatory Visit: Payer: Self-pay | Admitting: Nurse Practitioner

## 2021-04-26 DIAGNOSIS — E119 Type 2 diabetes mellitus without complications: Secondary | ICD-10-CM

## 2021-05-26 ENCOUNTER — Other Ambulatory Visit: Payer: Self-pay | Admitting: Nurse Practitioner

## 2021-05-26 DIAGNOSIS — E119 Type 2 diabetes mellitus without complications: Secondary | ICD-10-CM

## 2021-07-18 ENCOUNTER — Ambulatory Visit: Payer: Self-pay | Admitting: Physician Assistant

## 2021-07-18 ENCOUNTER — Other Ambulatory Visit: Payer: Self-pay

## 2021-07-18 DIAGNOSIS — E782 Mixed hyperlipidemia: Secondary | ICD-10-CM

## 2021-07-18 DIAGNOSIS — I10 Essential (primary) hypertension: Secondary | ICD-10-CM

## 2021-07-18 DIAGNOSIS — E119 Type 2 diabetes mellitus without complications: Secondary | ICD-10-CM

## 2021-07-18 MED ORDER — ATORVASTATIN CALCIUM 80 MG PO TABS: 80.0000 mg | ORAL_TABLET | Freq: Every day | ORAL | 0 refills | Status: DC

## 2021-07-18 MED ORDER — AMLODIPINE BESY-BENAZEPRIL HCL 5-20 MG PO CAPS: 1.0000 | ORAL_CAPSULE | Freq: Every day | ORAL | 0 refills | Status: DC

## 2021-07-18 MED ORDER — METFORMIN HCL 1000 MG PO TABS: 1000.0000 mg | ORAL_TABLET | Freq: Two times a day (BID) | ORAL | 0 refills | Status: DC

## 2021-07-18 NOTE — Progress Notes (Signed)
1 month courtesy refills given, patient to follow-up with mobile medicine unit for further refills or follow-up with primary care provider.  Patient understands and agrees  Roney Jaffe, PA-C Physician Assistant [facility] [email or web address]

## 2021-07-25 ENCOUNTER — Ambulatory Visit: Payer: Self-pay

## 2021-08-23 ENCOUNTER — Other Ambulatory Visit: Payer: Self-pay | Admitting: Physician Assistant

## 2021-08-23 DIAGNOSIS — I10 Essential (primary) hypertension: Secondary | ICD-10-CM

## 2021-08-28 ENCOUNTER — Ambulatory Visit (INDEPENDENT_AMBULATORY_CARE_PROVIDER_SITE_OTHER): Payer: Self-pay | Admitting: Family Medicine

## 2021-08-28 ENCOUNTER — Encounter: Payer: Self-pay | Admitting: Family Medicine

## 2021-08-28 ENCOUNTER — Other Ambulatory Visit: Payer: Self-pay

## 2021-08-28 VITALS — Temp 98.1°F | Resp 16 | Wt 197.0 lb

## 2021-08-28 DIAGNOSIS — I10 Essential (primary) hypertension: Secondary | ICD-10-CM

## 2021-08-28 DIAGNOSIS — G4700 Insomnia, unspecified: Secondary | ICD-10-CM

## 2021-08-28 DIAGNOSIS — E119 Type 2 diabetes mellitus without complications: Secondary | ICD-10-CM

## 2021-08-28 DIAGNOSIS — E782 Mixed hyperlipidemia: Secondary | ICD-10-CM

## 2021-08-28 LAB — POCT GLYCOSYLATED HEMOGLOBIN (HGB A1C): Hemoglobin A1C: 7 % — AB (ref 4.0–5.6)

## 2021-08-28 MED ORDER — AMLODIPINE BESY-BENAZEPRIL HCL 5-20 MG PO CAPS: 1.0000 | ORAL_CAPSULE | Freq: Every day | ORAL | 0 refills | Status: DC

## 2021-08-28 MED ORDER — AMOXICILLIN 875 MG PO TABS: 875.0000 mg | ORAL_TABLET | Freq: Two times a day (BID) | ORAL | 0 refills | Status: AC

## 2021-08-28 MED ORDER — METFORMIN HCL 1000 MG PO TABS: 1000.0000 mg | ORAL_TABLET | Freq: Two times a day (BID) | ORAL | 0 refills | Status: DC

## 2021-08-28 MED ORDER — ATORVASTATIN CALCIUM 80 MG PO TABS: 80.0000 mg | ORAL_TABLET | Freq: Every day | ORAL | 0 refills | Status: DC

## 2021-08-28 NOTE — Progress Notes (Signed)
Established Patient Office Visit  Subjective:  Patient ID: Jose Deleon, male    DOB: 01/07/1961  Age: 61 y.o. MRN: [id number]  CC:  Chief Complaint  Patient presents with   Follow-up   Medication Refill   Diabetes    HPI Jose Deleon presents for follow up of chronic med issues including diabetes and hypertension. Patient also complains of tooth pain and has an abscess.   Past Medical History:  Diagnosis Date   FH: heart disease    GERD (gastroesophageal reflux disease)    High cholesterol    History of gout    "reaction from the TB RX"   Hypertension 2015   TB (pulmonary tuberculosis) 1990   Type II diabetes mellitus ([place]) dx ~ 2015    Past Surgical History:  Procedure Laterality Date   CARDIAC CATHETERIZATION N/A 03/13/2016   Procedure: Left Heart Cath and Coronary Angiography;  Surgeon: Peter M Martinique, MD;  Location: [place] [facility];  Service: Cardiovascular;  Laterality: N/A;   HEMORRHOID SURGERY  1981   "lanced"    Family History  Problem Relation Age of Onset   Hypertension Mother    Heart attack Mother 37   Hypertension Father 40   Multiple sclerosis Father    Stroke Maternal Grandmother     Social History   Socioeconomic History   Marital status: Single    Spouse name: Not on file   Number of children: Not on file   Years of education: Not on file   Highest education level: Not on file  Occupational History   Occupation: Retired Academic librarian business  Tobacco Use   Smoking status: Light Smoker    Packs/day: 0.33    Years: 30.00    Pack years: 9.90    Types: Cigarettes    Last attempt to quit: 03/07/2016    Years since quitting: 5.4   Smokeless tobacco: Never   Tobacco comments:    1-2 cigs a day  Substance and Sexual Activity   Alcohol use: Yes    Alcohol/week: 4.0 standard drinks    Types: 4 Cans of beer per week   Drug use: No   Sexual activity: Not Currently  Other Topics Concern   Not on file  Social History Narrative   Not on  file   Social Determinants of Health   Financial Resource Strain: Not on file  Food Insecurity: Not on file  Transportation Needs: Not on file  Physical Activity: Not on file  Stress: Not on file  Social Connections: Not on file  Intimate Partner Violence: Not on file    ROS Review of Systems  All other systems reviewed and are negative.  Objective:   Today's Vitals: Temp 98.1 F (36.7 C) (Oral)    Resp 16    Wt 197 lb (89.4 kg)    SpO2 95%    BMI 24.95 kg/m   Physical Exam Vitals and nursing note reviewed.  Constitutional:      General: He is not in acute distress. Cardiovascular:     Rate and Rhythm: Normal rate and regular rhythm.  Pulmonary:     Effort: Pulmonary effort is normal.     Breath sounds: Normal breath sounds.  Abdominal:     Palpations: Abdomen is soft.     Tenderness: There is no abdominal tenderness.  Musculoskeletal:     Right lower leg: No edema.     Left lower leg: No edema.  Neurological:     General: No focal  deficit present.     Mental Status: He is alert and oriented to person, place, and time.    Assessment & Plan:   1. Type 2 diabetes mellitus without complication, without long-term current use of insulin (HCC) Elevated A1c. Discussed dietary and activity options. Will monitor - POCT glycosylated hemoglobin (Hb A1C)  2. Essential hypertension Appears stable with present management. Continue . Meds refilled  3. Mixed hyperlipidemia Continue present management. Meds refilled  4. Insomnia, unspecified type Patient defers agent at this time 2/2 other side effects  5. Tooth pain Amox prescribed.    Outpatient Encounter Medications as of 08/28/2021  Medication Sig   amLODipine-benazepril (LOTREL) 5-20 MG capsule Take 1 capsule by mouth daily.   Apple Cider Vinegar 300 MG TABS Take by mouth every morning.   aspirin 81 MG tablet Take 1 tablet (81 mg total) by mouth daily.   atorvastatin (LIPITOR) 80 MG tablet Take 1 tablet (80 mg  total) by mouth daily. To lower cholesterol   Blood Glucose Monitoring Suppl (TRUERESULT BLOOD GLUCOSE) w/Device KIT 1 each by Does not apply route daily.   Cinnamon 500 MG TABS Take by mouth.   cyclobenzaprine (FLEXERIL) 10 MG tablet Take 1 tablet (10 mg total) by mouth 2 (two) times daily as needed for muscle spasms.   Garlic 544 MG TABS Take by mouth every morning.   glucose blood (TRUETEST TEST) test strip 1 each by Other route 2 (two) times daily. Use as instructed   metFORMIN (GLUCOPHAGE) 1000 MG tablet Take 1 tablet (1,000 mg total) by mouth 2 (two) times daily with a meal.   sildenafil (VIAGRA) 100 MG tablet Take 0.5-1 tablets (50-100 mg total) by mouth daily as needed for erectile dysfunction.   traZODone (DESYREL) 50 MG tablet Take 0.5-1 tablets (25-50 mg total) by mouth at bedtime as needed for sleep. (Patient not taking: Reported on 08/28/2021)   No facility-administered encounter medications on file as of 08/28/2021.    Follow-up: No follow-ups on file.   Becky Sax, MD

## 2021-08-28 NOTE — Progress Notes (Signed)
Patient is here for medication refill. Patient would like an ABX for tooth pain.  Patient need medication  change  Patient has no concerns for PCP

## 2021-08-29 ENCOUNTER — Encounter: Payer: Self-pay | Admitting: Family Medicine

## 2021-08-30 ENCOUNTER — Telehealth: Payer: Self-pay | Admitting: Family Medicine

## 2021-08-30 ENCOUNTER — Encounter: Payer: Self-pay | Admitting: Family Medicine

## 2021-08-30 NOTE — Telephone Encounter (Signed)
Pt states he spoke w/ PCP at last appt about a medication for Insomnia  but does not recall the name. Pt states other meds were sent to pharmacy except for the one for insomnia and needs it.  Pharmacy  [facility] [address], [place] [address]  [place], [place] [address]  Phone:  [phone]  Fax:  [phone]

## 2021-09-04 ENCOUNTER — Encounter: Payer: Self-pay | Admitting: Family Medicine

## 2021-09-04 ENCOUNTER — Telehealth (INDEPENDENT_AMBULATORY_CARE_PROVIDER_SITE_OTHER): Payer: Self-pay | Admitting: Family Medicine

## 2021-09-04 ENCOUNTER — Other Ambulatory Visit: Payer: Self-pay

## 2021-09-04 DIAGNOSIS — G4700 Insomnia, unspecified: Secondary | ICD-10-CM

## 2021-09-04 MED ORDER — AMITRIPTYLINE HCL 25 MG PO TABS: 25.0000 mg | ORAL_TABLET | Freq: Every evening | ORAL | 1 refills | Status: DC | PRN

## 2021-09-04 NOTE — Progress Notes (Signed)
Virtual Visit via Telephone Note  I connected with Jose Deleon on 09/04/21 at  2:20 PM EST by telephone and verified that I am speaking with the correct person using two identifiers.  Location: Patient: office Provider: home   I discussed the limitations, risks, security and privacy concerns of performing an evaluation and management service by telephone and the availability of in person appointments. I also discussed with the patient that there may be a patient responsible charge related to this service. The patient expressed understanding and agreed to proceed.   History of Present Illness: Patient reports that he has been having difficulty falling asleep. He has used trazodone in the past which he stopped 2/2 SE. He has not had much relief with melatonin.    Observations/Objective:   Assessment and Plan: 1. Insomnia, unspecified type Elavil 25 mg prescribed for prn use. monitor   Follow Up Instructions: 4-6 weeks   I discussed the assessment and treatment plan with the patient. The patient was provided an opportunity to ask questions and all were answered. The patient agreed with the plan and demonstrated an understanding of the instructions.   The patient was advised to call back or seek an in-person evaluation if the symptoms worsen or if the condition fails to improve as anticipated.  I provided 8 minutes of non-face-to-face time during this encounter.   Becky Sax, MD

## 2021-11-20 ENCOUNTER — Other Ambulatory Visit: Payer: Self-pay | Admitting: Family Medicine

## 2021-11-20 DIAGNOSIS — I10 Essential (primary) hypertension: Secondary | ICD-10-CM

## 2021-11-25 ENCOUNTER — Ambulatory Visit: Payer: Self-pay | Admitting: Family Medicine

## 2021-11-28 ENCOUNTER — Encounter: Payer: Self-pay | Admitting: Family Medicine

## 2021-11-28 DIAGNOSIS — E119 Type 2 diabetes mellitus without complications: Secondary | ICD-10-CM

## 2021-11-28 DIAGNOSIS — I10 Essential (primary) hypertension: Secondary | ICD-10-CM

## 2021-11-28 DIAGNOSIS — E782 Mixed hyperlipidemia: Secondary | ICD-10-CM

## 2021-11-28 MED ORDER — METFORMIN HCL 1000 MG PO TABS: 1000.0000 mg | ORAL_TABLET | Freq: Two times a day (BID) | ORAL | 0 refills | Status: DC

## 2021-11-28 MED ORDER — ATORVASTATIN CALCIUM 80 MG PO TABS: 80.0000 mg | ORAL_TABLET | Freq: Every day | ORAL | 0 refills | Status: DC

## 2021-11-28 MED ORDER — AMLODIPINE BESY-BENAZEPRIL HCL 5-20 MG PO CAPS: 1.0000 | ORAL_CAPSULE | Freq: Every day | ORAL | 0 refills | Status: DC

## 2021-11-28 NOTE — Progress Notes (Signed)
?  Patient deferred to schedule a face-to-face in office visit ?

## 2021-11-28 NOTE — Addendum Note (Signed)
Addended by: Jessie Foot E on: 11/28/2021 12:06 PM ? ? Modules accepted: Orders ? ?

## 2021-11-28 NOTE — Progress Notes (Signed)
Patient said he was expose d to COVID x 3 days ago. ? ?Patient request medication refill . ?

## 2022-02-28 ENCOUNTER — Ambulatory Visit: Payer: Self-pay | Admitting: Family Medicine

## 2022-03-06 ENCOUNTER — Other Ambulatory Visit: Payer: Self-pay | Admitting: Family Medicine

## 2022-03-06 DIAGNOSIS — I10 Essential (primary) hypertension: Secondary | ICD-10-CM

## 2022-03-06 NOTE — Telephone Encounter (Signed)
Requested medication (s) are due for refill today: yes  Requested medication (s) are on the active medication list: yes  Last refill:  11/28/21 #90/0  Future visit scheduled: yes 03/12/22  Notes to clinic:  pt would like a refill for the next 5 days until his appt / he has been out for 3 days / please advise      Requested Prescriptions  Pending Prescriptions Disp Refills   amLODipine-benazepril (LOTREL) 5-20 MG capsule 90 capsule 0    Sig: Take 1 capsule by mouth daily.     Cardiovascular: CCB + ACEI Combos Failed - 03/06/2022  9:13 AM      Failed - Cr in normal range and within 180 days    Creat  Date Value Ref Range Status  10/08/2015 0.98 0.70 - 1.33 mg/dL Final   Creatinine, Ser  Date Value Ref Range Status  03/23/2020 0.88 0.76 - 1.27 mg/dL Final   Creatinine, Urine  Date Value Ref Range Status  09/08/2014 180.7 mg/dL Final    Comment:    No reference range established.         Failed - K in normal range and within 180 days    Potassium  Date Value Ref Range Status  03/23/2020 4.4 3.5 - 5.2 mmol/L Final         Failed - Na in normal range and within 180 days    Sodium  Date Value Ref Range Status  03/23/2020 136 134 - 144 mmol/L Final         Failed - eGFR is 30 or above and within 180 days    GFR, Est African American  Date Value Ref Range Status  10/08/2015 >89 >=60 mL/min Final   GFR calc Af Amer  Date Value Ref Range Status  03/23/2020 108 >59 mL/min/1.73 Final    Comment:    **[facility] currently reports eGFR in compliance with the current**   recommendations of the [employer]. [facility] will   update reporting as new guidelines are published from the NKF-ASN   Task force.    GFR, Est Non African American  Date Value Ref Range Status  10/08/2015 86 >=60 mL/min Final    Comment:      The estimated GFR is a calculation valid for adults (>=75 years old) that uses the CKD-EPI algorithm to adjust for age and sex. It is   not to be  used for children, pregnant women, hospitalized patients,    patients on dialysis, or with rapidly changing kidney function. According to the NKDEP, eGFR >89 is normal, 60-89 shows mild impairment, 30-59 shows moderate impairment, 15-29 shows severe impairment and <15 is ESRD.      GFR calc non Af Amer  Date Value Ref Range Status  03/23/2020 93 >59 mL/min/1.73 Final         Failed - Valid encounter within last 6 months    Recent Outpatient Visits           6 months ago Insomnia, unspecified type   Primary Care at [facility], Clyde Canterbury, MD   6 months ago Type 2 diabetes mellitus without complication, without long-term current use of insulin [facility])   Primary Care at [facility], MD   1 year ago Type 2 diabetes mellitus without complication, without long-term current use of insulin [facility])   Primary Care at [facility], MD   1 year ago Type 2 diabetes mellitus without complication, without long-term current use  of insulin [facility])   Primary Care at [facility], Bayard Beaver, MD   2 years ago Encounter to establish care   Primary Care at [facility], Bayard Beaver, MD       Future Appointments             In 6 days Dorna Mai, MD Primary Care at [facility] - Patient is not pregnant      Passed - Last BP in normal range    BP Readings from Last 1 Encounters:  11/21/20 138/87

## 2022-03-06 NOTE — Telephone Encounter (Signed)
Medication Refill - Medication: Pt wasn't able to make last appt and rescheduled for next week but is out of amLODipine-benazepril (LOTREL) 5-20 MG capsule and would like a refill for the next 5 days until his appt / he has been out for 3 days / please advise   Has the patient contacted their pharmacy? No. (Agent: If no, request that the patient contact the pharmacy for the refill. If patient does not wish to contact the pharmacy document the reason why and proceed with request.) (Agent: If yes, when and what did the pharmacy advise?)  Preferred Pharmacy (with phone number or street name): [facility] [address] [place], [place] - [address]  [address], [place] [address]  Phone:  [phone]  Fax:  [phone]   Has the patient been seen for an appointment in the last year OR does the patient have an upcoming appointment? Yes.    Agent: Please be advised that RX refills may take up to 3 business days. We ask that you follow-up with your pharmacy.

## 2022-03-07 MED ORDER — AMLODIPINE BESY-BENAZEPRIL HCL 5-20 MG PO CAPS: 1.0000 | ORAL_CAPSULE | Freq: Every day | ORAL | 0 refills | Status: DC

## 2022-03-09 ENCOUNTER — Other Ambulatory Visit: Payer: Self-pay | Admitting: Internal Medicine

## 2022-03-09 DIAGNOSIS — N522 Drug-induced erectile dysfunction: Secondary | ICD-10-CM

## 2022-03-12 ENCOUNTER — Ambulatory Visit (INDEPENDENT_AMBULATORY_CARE_PROVIDER_SITE_OTHER): Payer: Self-pay | Admitting: Family Medicine

## 2022-03-12 ENCOUNTER — Ambulatory Visit: Payer: Self-pay | Admitting: Family Medicine

## 2022-03-12 VITALS — BP 127/83 | HR 71 | Temp 98.1°F | Resp 16 | Ht 75.0 in | Wt 198.0 lb

## 2022-03-12 DIAGNOSIS — E782 Mixed hyperlipidemia: Secondary | ICD-10-CM

## 2022-03-12 DIAGNOSIS — E119 Type 2 diabetes mellitus without complications: Secondary | ICD-10-CM

## 2022-03-12 DIAGNOSIS — B353 Tinea pedis: Secondary | ICD-10-CM

## 2022-03-12 DIAGNOSIS — G4700 Insomnia, unspecified: Secondary | ICD-10-CM

## 2022-03-12 DIAGNOSIS — F17210 Nicotine dependence, cigarettes, uncomplicated: Secondary | ICD-10-CM

## 2022-03-12 DIAGNOSIS — M79672 Pain in left foot: Secondary | ICD-10-CM

## 2022-03-12 DIAGNOSIS — F341 Dysthymic disorder: Secondary | ICD-10-CM

## 2022-03-12 DIAGNOSIS — M79671 Pain in right foot: Secondary | ICD-10-CM

## 2022-03-12 DIAGNOSIS — N522 Drug-induced erectile dysfunction: Secondary | ICD-10-CM

## 2022-03-12 LAB — POCT GLYCOSYLATED HEMOGLOBIN (HGB A1C): Hemoglobin A1C: 8.4 % — AB (ref 4.0–5.6)

## 2022-03-12 MED ORDER — ATORVASTATIN CALCIUM 80 MG PO TABS: 80.0000 mg | ORAL_TABLET | Freq: Every day | ORAL | 0 refills | Status: DC

## 2022-03-12 MED ORDER — METFORMIN HCL 1000 MG PO TABS: 1000.0000 mg | ORAL_TABLET | Freq: Two times a day (BID) | ORAL | 0 refills | Status: DC

## 2022-03-12 MED ORDER — MIRTAZAPINE 15 MG PO TABS: 15.0000 mg | ORAL_TABLET | Freq: Every day | ORAL | 1 refills | Status: DC

## 2022-03-12 MED ORDER — SILDENAFIL CITRATE 100 MG PO TABS: 50.0000 mg | ORAL_TABLET | Freq: Every day | ORAL | 5 refills | Status: DC | PRN

## 2022-03-12 NOTE — Progress Notes (Signed)
Patient is here for 3 month f/u. Patient is concern about foot fungus that he has. Patient is also concern about having is insomnia

## 2022-03-14 ENCOUNTER — Encounter: Payer: Self-pay | Admitting: Family Medicine

## 2022-03-14 NOTE — Progress Notes (Signed)
Established Patient Office Visit  Subjective    Patient ID: Jose Deleon, male    DOB: 02/22/1961  Age: 61 y.o. MRN: [id number]  CC:  Chief Complaint  Patient presents with   Follow-up    HPI Jose Deleon presents for routine follow up of chronic med issues. Patient also reports that he has bilateral foot pain as well as foot fungus.    Outpatient Encounter Medications as of 03/12/2022  Medication Sig   amitriptyline (ELAVIL) 25 MG tablet Take 1 tablet (25 mg total) by mouth at bedtime as needed for sleep.   amLODipine-benazepril (LOTREL) 5-20 MG capsule Take 1 capsule by mouth daily.   Apple Cider Vinegar 300 MG TABS Take by mouth every morning.   aspirin 81 MG tablet Take 1 tablet (81 mg total) by mouth daily.   Blood Glucose Monitoring Suppl (TRUERESULT BLOOD GLUCOSE) w/Device KIT 1 each by Does not apply route daily.   Cinnamon 500 MG TABS Take by mouth.   cyclobenzaprine (FLEXERIL) 10 MG tablet Take 1 tablet (10 mg total) by mouth 2 (two) times daily as needed for muscle spasms.   Garlic 630 MG TABS Take by mouth every morning.   glucose blood (TRUETEST TEST) test strip 1 each by Other route 2 (two) times daily. Use as instructed   mirtazapine (REMERON) 15 MG tablet Take 1 tablet (15 mg total) by mouth at bedtime.   [DISCONTINUED] atorvastatin (LIPITOR) 80 MG tablet Take 1 tablet (80 mg total) by mouth daily. To lower cholesterol   [DISCONTINUED] metFORMIN (GLUCOPHAGE) 1000 MG tablet Take 1 tablet (1,000 mg total) by mouth 2 (two) times daily with a meal.   [DISCONTINUED] sildenafil (VIAGRA) 100 MG tablet Take 0.5-1 tablets (50-100 mg total) by mouth daily as needed for erectile dysfunction.   atorvastatin (LIPITOR) 80 MG tablet Take 1 tablet (80 mg total) by mouth daily. To lower cholesterol   metFORMIN (GLUCOPHAGE) 1000 MG tablet Take 1 tablet (1,000 mg total) by mouth 2 (two) times daily with a meal.   sildenafil (VIAGRA) 100 MG tablet Take 0.5-1 tablets (50-100 mg total)  by mouth daily as needed for erectile dysfunction.   No facility-administered encounter medications on file as of 03/12/2022.    Past Medical History:  Diagnosis Date   FH: heart disease    GERD (gastroesophageal reflux disease)    High cholesterol    History of gout    "reaction from the TB RX"   Hypertension 2015   TB (pulmonary tuberculosis) 1990   Type II diabetes mellitus ([place]) dx ~ 2015    Past Surgical History:  Procedure Laterality Date   CARDIAC CATHETERIZATION N/A 03/13/2016   Procedure: Left Heart Cath and Coronary Angiography;  Surgeon: Peter M Martinique, MD;  Location: [facility];  Service: Cardiovascular;  Laterality: N/A;   HEMORRHOID SURGERY  1981   "lanced"    Family History  Problem Relation Age of Onset   Hypertension Mother    Heart attack Mother 25   Hypertension Father 23   Multiple sclerosis Father    Stroke Maternal Grandmother     Social History   Socioeconomic History   Marital status: Single    Spouse name: Not on file   Number of children: Not on file   Years of education: Not on file   Highest education level: Not on file  Occupational History   Occupation: Retired Academic librarian business  Tobacco Use   Smoking status: Light Smoker    Packs/day: 0.33  Years: 30.00    Total pack years: 9.90    Types: Cigarettes    Last attempt to quit: 03/07/2016    Years since quitting: 6.0   Smokeless tobacco: Never   Tobacco comments:    1-2 cigs a day  Substance and Sexual Activity   Alcohol use: Yes    Alcohol/week: 4.0 standard drinks of alcohol    Types: 4 Cans of beer per week   Drug use: No   Sexual activity: Not Currently  Other Topics Concern   Not on file  Social History Narrative   Not on file   Social Determinants of Health   Financial Resource Strain: Not on file  Food Insecurity: Not on file  Transportation Needs: Not on file  Physical Activity: Not on file  Stress: Not on file  Social Connections: Not on file   Intimate Partner Violence: Not on file    Review of Systems  All other systems reviewed and are negative.       Objective    BP 127/83   Pulse 71   Temp 98.1 F (36.7 C) (Oral)   Resp 16   Ht 6' 3"  (1.905 m)   Wt 198 lb (89.8 kg)   SpO2 94%   BMI 24.75 kg/m   Physical Exam Vitals and nursing note reviewed.  Constitutional:      General: He is not in acute distress. Cardiovascular:     Rate and Rhythm: Normal rate and regular rhythm.  Pulmonary:     Effort: Pulmonary effort is normal.     Breath sounds: Normal breath sounds.  Abdominal:     Palpations: Abdomen is soft.     Tenderness: There is no abdominal tenderness.  Musculoskeletal:     Right foot: Tenderness present. No deformity.     Left foot: Tenderness present. No deformity.  Neurological:     General: No focal deficit present.     Mental Status: He is alert and oriented to person, place, and time.         Assessment & Plan:   1. Type 2 diabetes mellitus without complication, without long-term current use of insulin (HCC) Increasing A1c and not at goal. Discussed compliance. Continue and monitor. Meds refilled.  - POCT glycosylated hemoglobin (Hb A1C) - metFORMIN (GLUCOPHAGE) 1000 MG tablet; Take 1 tablet (1,000 mg total) by mouth 2 (two) times daily with a meal.  Dispense: 180 tablet; Refill: 0 - atorvastatin (LIPITOR) 80 MG tablet; Take 1 tablet (80 mg total) by mouth daily. To lower cholesterol  Dispense: 90 tablet; Refill: 0  2. Bilateral foot pain Referral to podiatry for further eval/mgt - Ambulatory referral to Podiatry  3. Tinea pedis of both feet As above - Ambulatory referral to Podiatry  4. Insomnia, unspecified type Mirtazipine prescribed. monitor  5. Drug-induced erectile dysfunction Viagra prescribed - sildenafil (VIAGRA) 100 MG tablet; Take 0.5-1 tablets (50-100 mg total) by mouth daily as needed for erectile dysfunction.  Dispense: 30 tablet; Refill: 5  6. Mixed  hyperlipidemia continue - atorvastatin (LIPITOR) 80 MG tablet; Take 1 tablet (80 mg total) by mouth daily. To lower cholesterol  Dispense: 90 tablet; Refill: 0  7. Dysthymia Mirtazipine prescribed. Will monitor   Return in about 3 months (around 06/12/2022) for follow up.   Becky Sax, MD

## 2022-06-16 ENCOUNTER — Ambulatory Visit: Payer: Self-pay | Admitting: Family Medicine

## 2022-07-10 ENCOUNTER — Ambulatory Visit (INDEPENDENT_AMBULATORY_CARE_PROVIDER_SITE_OTHER): Payer: Medicaid Other | Admitting: Podiatry

## 2022-07-10 ENCOUNTER — Ambulatory Visit (INDEPENDENT_AMBULATORY_CARE_PROVIDER_SITE_OTHER): Payer: Medicaid Other

## 2022-07-10 DIAGNOSIS — Q828 Other specified congenital malformations of skin: Secondary | ICD-10-CM

## 2022-07-10 DIAGNOSIS — M792 Neuralgia and neuritis, unspecified: Secondary | ICD-10-CM

## 2022-07-10 DIAGNOSIS — M79672 Pain in left foot: Secondary | ICD-10-CM | POA: Diagnosis not present

## 2022-07-10 DIAGNOSIS — B351 Tinea unguium: Secondary | ICD-10-CM

## 2022-07-10 DIAGNOSIS — M21611 Bunion of right foot: Secondary | ICD-10-CM | POA: Diagnosis not present

## 2022-07-10 DIAGNOSIS — M79671 Pain in right foot: Secondary | ICD-10-CM | POA: Diagnosis not present

## 2022-07-10 DIAGNOSIS — E1142 Type 2 diabetes mellitus with diabetic polyneuropathy: Secondary | ICD-10-CM

## 2022-07-10 DIAGNOSIS — B353 Tinea pedis: Secondary | ICD-10-CM

## 2022-07-10 MED ORDER — CICLOPIROX 8 % EX SOLN: Freq: Every day | CUTANEOUS | 0 refills | Status: DC

## 2022-07-10 MED ORDER — GABAPENTIN 300 MG PO CAPS: 300.0000 mg | ORAL_CAPSULE | Freq: Every day | ORAL | 3 refills | Status: DC

## 2022-07-10 MED ORDER — KETOCONAZOLE 2 % EX CREA: 1.0000 | TOPICAL_CREAM | Freq: Every day | CUTANEOUS | 0 refills | Status: DC

## 2022-07-10 NOTE — Progress Notes (Signed)
Subjective:  Patient ID: Jose Deleon, male    DOB: 08-10-1960,  MRN: [id number]  Chief Complaint  Patient presents with   Foot Problem    Bilateral foot pain; Tinea pedis of both feet    61 y.o. male presents with bilateral foot pain.  He also has concern for dry skin present on both feet concerned about athlete's foot fungal infection.  Also concerned about a bunion on his right foot which she says is painful with shoe gear wear.  His primary concern is probably related to pain in the second toe on the right foot which has been bothering him for a while.  He does have a history of diabetes type 2.  Does not take any nerve medication that he is aware of.  He says that he feels shooting sharp electric pains going down to the second toe that is sporadic in nature.  He does also have a history of gout attack in his foot as well.   Past Medical History:  Diagnosis Date   FH: heart disease    GERD (gastroesophageal reflux disease)    High cholesterol    History of gout    "reaction from the TB RX"   Hypertension 2015   TB (pulmonary tuberculosis) 1990   Type II diabetes mellitus ([place]) dx ~ 2015    No Known Allergies  ROS: Negative except as per HPI above  Objective:  General: AAO x3, NAD  Dermatological: Hyperkeratotic lesion present subfifth metatarsal head with central hyperkeratotic core bilateral foot with pain on palpation.  Vascular:  Dorsalis Pedis artery and Posterior Tibial artery pedal pulses are 2/4 bilateral.  Capillary fill time < 3 sec to all digits.   Neruologic: Grossly intact via light touch bilateral. Protective threshold intact to all sites bilateral.  Patient has subjective shooting burning electric pains to the second toe of the right foot  Musculoskeletal: On the right foot there is a large painful medial eminence at the first metatarsal consistent with bunion deformity.  The second toe has hammertoe deformity and contracture with pain at the distal  interphalangeal joint.  Gait: Unassisted, Nonantalgic.   No images are attached to the encounter.  Radiographs:  Date: 07/10/2022 XR bilateral foot weightbearing AP/Lateral/Oblique   Findings: Hallux valgus deformity is present bilaterally.  No acute obvious fracture is identified in the second toe of the right foot or left foot.  Arthritic changes are noted.  No evidence of Charcot neuropathic arthropathy. Assessment:   1. Neuropathic pain   2. Tinea pedis of both feet   3. Bunion of right foot   4. Onychomycosis   5. Porokeratosis   6. Foot pain, bilateral   7. DM type 2 with diabetic peripheral neuropathy ([place])      Plan:  Patient was evaluated and treated and all questions answered.  # Neuropathic pain related to type 2 diabetes -Discussed I believe the patient's second toe pain in terms of burning shooting electric type shock pain is related to peripheral neuropathy. -Recommend trial of gabapentin 300 mg take once daily at night. -Discussed that the medication can make him tired and he should not drive until he understands how it affects him.   # Tinea pedis bilateral foot Discussed the etiology and treatment options for tinea pedis.  Discussed topical and oral treatment.  Recommended topical treatment with 2% ketoconazole cream.  This was sent to the patient's pharmacy.  Also discussed appropriate foot hygiene, use of antifungal spray such as Tinactin in shoes,  as well as cleaning her foot surfaces such as showers and bathroom floors with bleach.  #Bunion of right foot -Discussed conservative and surgical treatment options -Discussed wider toebox shoes as the primary conservative option -Surgically could consider Lapidus bunionectomy versus minimally invasive bunionectomy with distal first metatarsal osteotomy. -Patient wishes to think about surgery and we will discuss at next appointment in 6 weeks -Recommend power step inserts for the time being to see if these provide  any pain relief.  Dispensed 1 pair of power steps to the patient at this visit.  #Onychomycosis Onychomycosis -Educated on etiology of nail fungus. -Discussed oral antifungal however patient would like to defer at this time.  We will instead proceed with topical antifungal discussed that this is relatively infective but patient wishes to proceed with trying. -eRx for Penlac 8% solution apply topically to all nails once daily at night for the next 3 to 6 months.  # Painful callus/porokeratosis present bilateral plantar fifth met head All symptomatic hyperkeratoses x 2 lesions 1 each at the plantar aspect of the fifth metatarsal head were safely debrided with a sterile #15 blade to patient's level of comfort without incident. We discussed preventative and palliative care of these lesions including supportive and accommodative shoegear, padding, prefabricated and custom molded accommodative orthoses, use of a pumice stone and lotions/creams daily.   Return in about 6 weeks (around 08/21/2022) for Follow-up multiple bilateral foot issues.          Everitt Amber, DPM [facility]

## 2022-07-17 ENCOUNTER — Ambulatory Visit: Payer: Medicaid Other | Admitting: Podiatry

## 2022-07-21 DIAGNOSIS — L84 Corns and callosities: Secondary | ICD-10-CM

## 2022-07-21 DIAGNOSIS — M2011 Hallux valgus (acquired), right foot: Secondary | ICD-10-CM

## 2022-07-21 DIAGNOSIS — M778 Other enthesopathies, not elsewhere classified: Secondary | ICD-10-CM

## 2022-07-21 HISTORY — DX: Hallux valgus (acquired), right foot: M20.11

## 2022-07-21 HISTORY — DX: Other enthesopathies, not elsewhere classified: M77.8

## 2022-07-21 HISTORY — DX: Corns and callosities: L84

## 2022-07-23 ENCOUNTER — Ambulatory Visit: Payer: Medicaid Other | Admitting: Family Medicine

## 2022-07-31 ENCOUNTER — Telehealth: Payer: Self-pay | Admitting: *Deleted

## 2022-07-31 NOTE — Telephone Encounter (Signed)
Left message on voicemail to return call.  Patient left message below requesting to be scheduled.   Appointment Request From: Jose Deleon   With Provider: Becky Sax, MD [facility] [place]   Preferred Date Range: 08/21/2022 - 08/28/2022   Preferred Times: Monday Afternoon, Tuesday Afternoon, Wednesday Afternoon, Thursday Afternoon, Friday Afternoon   Reason for visit: Office Visit   Comments: Insomnia medication

## 2022-08-18 ENCOUNTER — Other Ambulatory Visit: Payer: Self-pay | Admitting: Podiatry

## 2022-08-18 DIAGNOSIS — M79671 Pain in right foot: Secondary | ICD-10-CM

## 2022-08-18 DIAGNOSIS — E1142 Type 2 diabetes mellitus with diabetic polyneuropathy: Secondary | ICD-10-CM

## 2022-08-18 DIAGNOSIS — B351 Tinea unguium: Secondary | ICD-10-CM

## 2022-08-18 DIAGNOSIS — M792 Neuralgia and neuritis, unspecified: Secondary | ICD-10-CM

## 2022-08-18 DIAGNOSIS — M21611 Bunion of right foot: Secondary | ICD-10-CM

## 2022-08-18 DIAGNOSIS — B353 Tinea pedis: Secondary | ICD-10-CM

## 2022-08-18 DIAGNOSIS — Q828 Other specified congenital malformations of skin: Secondary | ICD-10-CM

## 2022-08-28 ENCOUNTER — Ambulatory Visit: Payer: Medicaid Other | Admitting: Podiatry

## 2022-09-04 ENCOUNTER — Ambulatory Visit (INDEPENDENT_AMBULATORY_CARE_PROVIDER_SITE_OTHER): Payer: Medicaid Other | Admitting: Podiatry

## 2022-09-04 DIAGNOSIS — M792 Neuralgia and neuritis, unspecified: Secondary | ICD-10-CM

## 2022-09-04 DIAGNOSIS — B353 Tinea pedis: Secondary | ICD-10-CM

## 2022-09-04 NOTE — Progress Notes (Signed)
Pt was a no show for this apt, no charge

## 2022-09-08 ENCOUNTER — Other Ambulatory Visit: Payer: Self-pay | Admitting: Family Medicine

## 2022-09-08 DIAGNOSIS — I10 Essential (primary) hypertension: Secondary | ICD-10-CM

## 2022-09-17 ENCOUNTER — Ambulatory Visit
Admission: EM | Admit: 2022-09-17 | Discharge: 2022-09-17 | Disposition: A | Discharge status: Home / Self Care | Payer: Medicaid Other | Attending: Internal Medicine | Admitting: Internal Medicine

## 2022-09-17 DIAGNOSIS — K0889 Other specified disorders of teeth and supporting structures: Secondary | ICD-10-CM

## 2022-09-17 DIAGNOSIS — K047 Periapical abscess without sinus: Secondary | ICD-10-CM | POA: Diagnosis not present

## 2022-09-17 MED ORDER — AMOXICILLIN-POT CLAVULANATE 875-125 MG PO TABS: 1.0000 | ORAL_TABLET | Freq: Two times a day (BID) | ORAL | 0 refills | Status: DC

## 2022-09-17 MED ORDER — OXYCODONE HCL 5 MG PO TABS: 5.0000 mg | ORAL_TABLET | Freq: Four times a day (QID) | ORAL | 0 refills | Status: DC | PRN

## 2022-09-17 NOTE — Discharge Instructions (Addendum)
I am treating you with an antibiotic for dental infection.  I have also prescribed a pain medication for you to take as needed.  Please use this sparingly as it can cause drowsiness.  Do not drive, operate heavy machinery, drink alcohol with taking this medication.  Follow-up with dentist for further evaluation.

## 2022-09-17 NOTE — ED Triage Notes (Signed)
Pt states that he has dental pain. X3 days

## 2022-09-17 NOTE — ED Provider Notes (Signed)
[facility]    CSN: [id number] Arrival date & time: 09/17/22  1424      History   Chief Complaint Chief Complaint  Patient presents with   Dental Pain    Dental pain. X3 days    HPI Jose Deleon is a 62 y.o. male.   Patient presents with lower dental pain that started about 3 days ago.  Patient reports that he has taken Tylenol and ibuprofen consistently with no improvement in pain.  States that he went to a walk-in dentist today but was not able to be seen given no availability.  Patient denies any associated fever.   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 Authorizing Provider  amitriptyline (ELAVIL) 25 MG tablet Take 1 tablet (25 mg total) by mouth at bedtime as needed for sleep. 09/04/21  Yes Dorna Mai, MD  amLODipine-benazepril (LOTREL) 5-20 MG capsule Take 1 capsule by mouth once daily 09/09/22  Yes Dorna Mai, MD  amoxicillin-clavulanate (AUGMENTIN) 875-125 MG tablet Take 1 tablet by  mouth every 12 (twelve) hours. 09/17/22  Yes , Michele Rockers, FNP  Apple Cider Vinegar 300 MG TABS Take by mouth every morning.   Yes [provider]  aspirin 81 MG tablet Take 1 tablet (81 mg total) by mouth daily. 03/16/18  Yes Fulp, Cammie, MD  atorvastatin (LIPITOR) 80 MG tablet Take 1 tablet (80 mg total) by mouth daily. To lower cholesterol 03/12/22  Yes Dorna Mai, MD  Blood Glucose Monitoring Suppl (TRUERESULT BLOOD GLUCOSE) w/Device KIT 1 each by Does not apply route daily. 03/16/18  Yes Fulp, Cammie, MD  ciclopirox (PENLAC) 8 % solution Apply topically at bedtime. Apply over nail and surrounding skin. Apply daily over previous coat. After seven (7) days, may remove with alcohol and continue cycle. 07/10/22  Yes Standiford, Nena Alexander, DPM  Cinnamon 500 MG TABS Take by mouth.   Yes [provider]  cyclobenzaprine (FLEXERIL) 10 MG tablet Take 1 tablet (10 mg total) by mouth 2 (two) times daily as needed for muscle spasms. 01/16/20  Yes Nicolette Bang, MD  gabapentin (NEURONTIN) 300 MG capsule Take 1 capsule (300 mg total) by mouth daily after supper. 07/10/22  Yes Standiford, Nena Alexander, DPM  Garlic 123XX123 MG TABS Take by mouth every morning.   Yes [provider]  glucose blood (TRUETEST TEST) test strip 1 each by Other route 2 (two)  times daily. Use as instructed 03/16/18  Yes Fulp, Cammie, MD  ketoconazole (NIZORAL) 2 % cream Apply 1 Application topically daily. 07/10/22  Yes Standiford, Nena Alexander, DPM  metFORMIN (GLUCOPHAGE) 1000 MG tablet Take 1 tablet (1,000 mg total) by mouth 2 (two) times daily with a meal. 03/12/22  Yes Dorna Mai, MD  mirtazapine (REMERON) 15 MG tablet Take 1 tablet (15 mg total) by mouth at bedtime. 03/12/22  Yes Dorna Mai, MD  oxyCODONE (OXY IR/ROXICODONE) 5 MG immediate release tablet Take 1 tablet (5 mg total) by mouth every 6 (six) hours as needed for severe pain. 09/17/22  Yes , Hildred Alamin E, FNP  sildenafil (VIAGRA)  100 MG tablet Take 0.5-1 tablets (50-100 mg total) by mouth daily as needed for erectile dysfunction. 03/12/22  Yes Dorna Mai, MD    Family History Family History  Problem Relation Age of Onset   Hypertension Mother    Heart attack Mother 3   Hypertension Father 32   Multiple sclerosis Father    Stroke Maternal Grandmother     Social History Social History   Tobacco Use   Smoking status: Light Smoker    Packs/day: 0.33    Years: 30.00    Total pack years: 9.90    Types: Cigarettes    Last attempt to quit: 03/07/2016    Years since quitting: 6.5   Smokeless tobacco: Never   Tobacco comments:    1-2 cigs a day  Substance Use Topics   Alcohol use: Yes    Alcohol/week: 4.0 standard drinks of alcohol    Types: 4 Cans of beer per week   Drug use: No     Allergies   Patient has no known allergies.   Review of Systems Review of Systems Per HPI  Physical Exam Triage Vital Signs ED Triage Vitals  Enc Vitals Group     BP 09/17/22 1634 (!) 159/90     Pulse Rate 09/17/22 1634 64     Resp 09/17/22 1634 18     Temp 09/17/22 1634 97.8 F (36.6 C)     Temp Source 09/17/22 1634 Oral     SpO2 09/17/22 1634 97 %     Weight 09/17/22 1632 195 lb (88.5 kg)     Height 09/17/22 1632 '6\' 3"'$  (1.905 m)     Head Circumference --      Peak Flow --      Pain Score 09/17/22 1632 10     Pain Loc --      Pain Edu? --      Excl. in [place]? --    No data found.  Updated Vital Signs BP (!) 159/90 (BP Location: Left Arm)   Pulse 64   Temp 97.8 F (36.6 C) (Oral)   Resp 18   Ht '6\' 3"'$  (1.905 m)   Wt 195 lb (88.5 kg)   SpO2 97%   BMI 24.37 kg/m   Visual Acuity Right Eye Distance:   Left Eye Distance:   Bilateral Distance:    Right Eye Near:   Left Eye Near:    Bilateral Near:     Physical Exam Constitutional:      General: He is not in acute distress.    Appearance: Normal appearance. He is not toxic-appearing or diaphoretic.  HENT:     Head: Normocephalic and  atraumatic.     Mouth/Throat:     Dentition: Abnormal dentition. Dental tenderness, gingival swelling and dental caries present.      Comments: Has  gingival swelling and erythema present to right lower dentition.  No obvious abscess noted or area of fluctuance. Eyes:     Extraocular Movements: Extraocular movements intact.     Conjunctiva/sclera: Conjunctivae normal.  Pulmonary:     Effort: Pulmonary effort is normal.  Neurological:     General: No focal deficit present.     Mental Status: He is alert and oriented to person, place, and time. Mental status is at baseline.  Psychiatric:        Mood and Affect: Mood normal.        Behavior: Behavior normal.        Thought Content: Thought content normal.        Judgment: Judgment normal.      UC Treatments / Results  Labs (all labs ordered are listed, but only abnormal results are displayed) Labs Reviewed - No data to display  EKG   Radiology No results found.  Procedures Procedures (including critical care time)  Medications Ordered in UC Medications - No data to display  Initial Impression / Assessment and Plan / UC Course  I have reviewed the triage vital signs and the nursing notes.  Pertinent labs & imaging results that were available during my care of the patient were reviewed by me and considered in my medical decision making (see chart for details).     Patient has dental infection so will treat with Augmentin antibiotic.  Patient requesting pain medication.  Will avoid Tylenol and ibuprofen given the patient has been taking this consistently and this is risky.  Therefore, the best option is oxycodone at low-dose.  Educated patient that this medication can be addictive and do not drive or drink alcohol while taking it.  Also advised patient to use it sparingly.  He reports that he only takes metformin and blood pressure medication and no other sedating medications so this should be safe. He also states that he does  not do any illicit drugs.  PDMP reviewed.  Advised following up with dentist for further evaluation and management. patient was provided with dental resources paperwork.  Discussed return precautions.  Patient verbalized understanding and was agreeable with plan. Final Clinical Impressions(s) / UC Diagnoses   Final diagnoses:  Dental infection  Pain, dental     Discharge Instructions      I am treating you with an antibiotic for dental infection.  I have also prescribed a pain medication for you to take as needed.  Please use this sparingly as it can cause drowsiness.  Do not drive, operate heavy machinery, drink alcohol with taking this medication.  Follow-up with dentist for further evaluation.    ED Prescriptions     Medication Sig Dispense Auth. Provider   amoxicillin-clavulanate (AUGMENTIN) 875-125 MG tablet Take 1 tablet by mouth every 12 (twelve) hours. 14 tablet [place], Finklea E, [place]   oxyCODONE (OXY IR/ROXICODONE) 5 MG immediate release tablet Take 1 tablet (5 mg total) by mouth every 6 (six) hours as needed for severe pain. 10 tablet [place], Michele Rockers, [place]      I have reviewed the PDMP during this encounter.   Teodora Medici, [place] 09/17/22 [phone]

## 2022-09-23 ENCOUNTER — Other Ambulatory Visit: Payer: Self-pay | Admitting: Family Medicine

## 2022-09-23 DIAGNOSIS — E119 Type 2 diabetes mellitus without complications: Secondary | ICD-10-CM

## 2022-09-24 NOTE — Telephone Encounter (Signed)
Requested Prescriptions  Pending Prescriptions Disp Refills   metFORMIN (GLUCOPHAGE) 1000 MG tablet [Pharmacy Med Name: metFORMIN HCl 1000 MG Oral Tablet] 180 tablet 0    Sig: TAKE 1 TABLET BY MOUTH TWICE DAILY WITH A MEAL     Endocrinology:  Diabetes - Biguanides Failed - 09/23/2022  9:05 PM      Failed - Cr in normal range and within 360 days    Creat  Date Value Ref Range Status  10/08/2015 0.98 0.70 - 1.33 mg/dL Final   Creatinine, Ser  Date Value Ref Range Status  03/23/2020 0.88 0.76 - 1.27 mg/dL Final   Creatinine, Urine  Date Value Ref Range Status  09/08/2014 180.7 mg/dL Final    Comment:    No reference range established.         Failed - HBA1C is between 0 and 7.9 and within 180 days    Hemoglobin A1C  Date Value Ref Range Status  03/12/2022 8.4 (A) 4.0 - 5.6 % Final   HbA1c, POC (controlled diabetic range)  Date Value Ref Range Status  11/21/2020 8.0 (A) 0.0 - 7.0 % Final         Failed - eGFR in normal range and within 360 days    GFR, Est African American  Date Value Ref Range Status  10/08/2015 >89 >=60 mL/min Final   GFR calc Af Amer  Date Value Ref Range Status  03/23/2020 108 >59 mL/min/1.73 Final    Comment:    **[facility] currently reports eGFR in compliance with the current**   recommendations of the [employer]. [facility] will   update reporting as new guidelines are published from the NKF-ASN   Task force.    GFR, Est Non African American  Date Value Ref Range Status  10/08/2015 86 >=60 mL/min Final    Comment:      The estimated GFR is a calculation valid for adults (>=81 years old) that uses the CKD-EPI algorithm to adjust for age and sex. It is   not to be used for children, pregnant women, hospitalized patients,    patients on dialysis, or with rapidly changing kidney function. According to the NKDEP, eGFR >89 is normal, 60-89 shows mild impairment, 30-59 shows moderate impairment, 15-29 shows severe impairment and <15 is  ESRD.      GFR calc non Af Amer  Date Value Ref Range Status  03/23/2020 93 >59 mL/min/1.73 Final         Failed - B12 Level in normal range and within 720 days    No results found for: "VITAMINB12"       Failed - Valid encounter within last 6 months    Recent Outpatient Visits           6 months ago Type 2 diabetes mellitus without complication, without long-term current use of insulin ([place])   [place] [facility], MD   1 year ago Insomnia, unspecified type   [place] [facility], Clyde Canterbury, MD   1 year ago Type 2 diabetes mellitus without complication, without long-term current use of insulin [facility])   [place] [facility], Clyde Canterbury, MD   1 year ago Type 2 diabetes mellitus without complication, without long-term current use of insulin [facility])   [place] [facility], Bayard Beaver, MD   2 years ago Type 2 diabetes mellitus without complication, without long-term current use of insulin ([place])  [place] [facility], Bayard Beaver, MD              Failed - CBC within normal limits and completed in the last 12 months    WBC  Date Value Ref Range Status  03/23/2020 7.3 3.4 - 10.8 x10E3/uL Final  05/03/2018 9.9 4.0 - 10.5 K/uL Final   RBC  Date Value Ref Range Status  03/23/2020 4.96 4.14 - 5.80 x10E6/uL Final  05/03/2018 4.91 4.22 - 5.81 MIL/uL Final   Hemoglobin  Date Value Ref Range Status  03/23/2020 14.8 13.0 - 17.7 g/dL Final   Hematocrit  Date Value Ref Range Status  03/23/2020 42.6 37.5 - 51.0 % Final   MCHC  Date Value Ref Range Status  03/23/2020 34.7 31.5 - 35.7 g/dL Final  05/03/2018 32.0 30.0 - 36.0 g/dL Final   [facility]  Date Value Ref Range Status  03/23/2020 29.8 26.6 - 33.0 pg Final  05/03/2018 29.9 26.0 - 34.0 pg Final   MCV  Date Value Ref Range Status  03/23/2020 86 79 - 97 fL Final   No  results found for: "PLTCOUNTKUC", "LABPLAT", "POCPLA" RDW  Date Value Ref Range Status  03/23/2020 12.3 11.6 - 15.4 % Final

## 2022-09-30 ENCOUNTER — Ambulatory Visit
Admission: EM | Admit: 2022-09-30 | Discharge: 2022-09-30 | Disposition: A | Discharge status: Home / Self Care | Payer: Medicaid Other | Attending: Family Medicine | Admitting: Family Medicine

## 2022-09-30 DIAGNOSIS — K047 Periapical abscess without sinus: Secondary | ICD-10-CM | POA: Diagnosis not present

## 2022-09-30 MED ORDER — KETOROLAC TROMETHAMINE 30 MG/ML IJ SOLN
30.0000 mg | Freq: Once | INTRAMUSCULAR | Status: AC
  Administered 2022-09-30: 30 mg via INTRAMUSCULAR

## 2022-09-30 MED ORDER — AMOXICILLIN-POT CLAVULANATE 875-125 MG PO TABS: 1.0000 | ORAL_TABLET | Freq: Two times a day (BID) | ORAL | 0 refills | Status: AC

## 2022-09-30 MED ORDER — IBUPROFEN 800 MG PO TABS: 800.0000 mg | ORAL_TABLET | Freq: Three times a day (TID) | ORAL | 0 refills | Status: DC | PRN

## 2022-09-30 MED ORDER — HYDROCODONE-ACETAMINOPHEN 7.5-325 MG PO TABS: 1.0000 | ORAL_TABLET | Freq: Four times a day (QID) | ORAL | 0 refills | Status: DC | PRN

## 2022-09-30 NOTE — Discharge Instructions (Signed)
Take amoxicillin-clavulanate 875 mg--1 tab twice daily with food for 7 days  Take ibuprofen 800 mg--1 tab every 8 hours as needed for pain.  Hydrocodone 7.5 mg--1 tablet every 6 hours as needed for pain.  This is best taken with food.  It can cause sleepiness or dizziness

## 2022-09-30 NOTE — ED Triage Notes (Signed)
Pt presents with recurrent  dental abscess on right side X 2 days; pt states he has an appt with oral surgeon next month.

## 2022-09-30 NOTE — ED Provider Notes (Signed)
[facility]    CSN: [id number] Arrival date & time: 09/30/22  1351      History   Chief Complaint Chief Complaint  Patient presents with   Abscess    HPI Jose Deleon is a 62 y.o. male.    Abscess  Here for pain in his right lower jaw and left upper jaw.  He has been having recurrent trouble with dental infection.  He is on a wait list to be seen at [facility].  He was seen for similar on February 28 and was prescribed Augmentin and oxycodone 5 mg.  He states oxycodone 5 mg did not help much.  Ibuprofen 400 to 600 mg has helped a little bit and briefly.    Past Medical History:  Diagnosis Date   FH: heart disease    GERD (gastroesophageal reflux disease)    High cholesterol    History of gout    "reaction from the TB RX"   Hypertension 2015   TB (pulmonary tuberculosis) 1990   Type II diabetes mellitus ([place]) dx ~ 2015    Patient Active Problem List   Diagnosis Date Noted   STRONG FH: heart disease    Insomnia 10/08/2015   Drug-induced erectile dysfunction 10/08/2015   Dental abscess 09/08/2014   Current smoker 09/08/2014   Type 2 diabetes mellitus without complication, without long-term current use of insulin ([place])    Hypertension     Past Surgical History:  Procedure Laterality Date   CARDIAC CATHETERIZATION N/A 03/13/2016   Procedure: Left Heart Cath and Coronary Angiography;  Surgeon: Peter M Martinique, MD;  Location: [place] CV [facility];  Service: Cardiovascular;  Laterality: N/A;   [place]   "lanced"       Home Medications    Prior to Admission medications   Medication Sig Start Date End Date Taking? Authorizing Provider  amoxicillin-clavulanate (AUGMENTIN) 875-125 MG tablet Take 1 tablet by mouth 2 (two) times daily for 7 days. 09/30/22 10/07/22 Yes Khristopher Kapaun, Gwenlyn Perking, MD  HYDROcodone-acetaminophen (NORCO) 7.5-325 MG tablet Take 1 tablet by mouth every 6 (six) hours as needed (pain). 09/30/22  Yes Barrett Henle, MD   ibuprofen (ADVIL) 800 MG tablet Take 1 tablet (800 mg total) by mouth every 8 (eight) hours as needed (pain). 09/30/22  Yes Barrett Henle, MD  amitriptyline (ELAVIL) 25 MG tablet Take 1 tablet (25 mg total) by mouth at bedtime as needed for sleep. 09/04/21   Dorna Mai, MD  amLODipine-benazepril (LOTREL) 5-20 MG capsule Take 1 capsule by mouth once daily 09/09/22   Dorna Mai, MD  Apple Cider Vinegar 300 MG TABS Take by mouth every morning.    [provider]  aspirin 81 MG tablet Take 1 tablet (81 mg total) by mouth daily. 03/16/18   Fulp, Cammie, MD  atorvastatin (LIPITOR) 80 MG tablet Take 1 tablet (80 mg total) by mouth daily. To lower cholesterol 03/12/22   Dorna Mai, MD  Blood Glucose Monitoring Suppl (TRUERESULT BLOOD GLUCOSE) w/Device KIT 1 each by Does not apply route daily. 03/16/18   Fulp, Cammie, MD  ciclopirox (PENLAC) 8 % solution Apply topically at bedtime. Apply over nail and surrounding skin. Apply daily over previous coat. After seven (7) days, may remove with alcohol and continue cycle. 07/10/22   Standiford, Nena Alexander, DPM  Cinnamon 500 MG TABS Take by mouth.    [provider]  cyclobenzaprine (FLEXERIL) 10 MG tablet Take 1 tablet (10 mg total) by mouth 2 (  two) times daily as needed for muscle spasms. 01/16/20   Nicolette Bang, MD  gabapentin (NEURONTIN) 300 MG capsule Take 1 capsule (300 mg total) by mouth daily after supper. 07/10/22   Standiford, Nena Alexander, DPM  Garlic 123XX123 MG TABS Take by mouth every morning.    [provider]  glucose blood (TRUETEST TEST) test strip 1 each by Other route 2 (two) times daily. Use as instructed 03/16/18   Fulp, Cammie, MD  ketoconazole (NIZORAL) 2 % cream Apply 1 Application topically daily. 07/10/22   Standiford, Nena Alexander, DPM  metFORMIN (GLUCOPHAGE) 1000 MG tablet TAKE 1 TABLET BY MOUTH TWICE DAILY WITH A MEAL 09/24/22   Dorna Mai, MD  mirtazapine (REMERON) 15 MG tablet Take 1  tablet (15 mg total) by mouth at bedtime. 03/12/22   Dorna Mai, MD  sildenafil (VIAGRA) 100 MG tablet Take 0.5-1 tablets (50-100 mg total) by mouth daily as needed for erectile dysfunction. 03/12/22   Dorna Mai, MD    Family History Family History  Problem Relation Age of Onset   Hypertension Mother    Heart attack Mother 94   Hypertension Father 58   Multiple sclerosis Father    Stroke Maternal Grandmother     Social History Social History   Tobacco Use   Smoking status: Light Smoker    Packs/day: 0.33    Years: 30.00    Total pack years: 9.90    Types: Cigarettes    Last attempt to quit: 03/07/2016    Years since quitting: 6.5   Smokeless tobacco: Never   Tobacco comments:    1-2 cigs a day  Substance Use Topics   Alcohol use: Yes    Alcohol/week: 4.0 standard drinks of alcohol    Types: 4 Cans of beer per week   Drug use: No     Allergies   Patient has no known allergies.   Review of Systems Review of Systems   Physical Exam Triage Vital Signs ED Triage Vitals  Enc Vitals Group     BP 09/30/22 1454 (!) 157/89     Pulse Rate 09/30/22 1454 79     Resp 09/30/22 1454 17     Temp 09/30/22 1454 98.1 F (36.7 C)     Temp Source 09/30/22 1454 Oral     SpO2 09/30/22 1454 92 %     Weight --      Height --      Head Circumference --      Peak Flow --      Pain Score 09/30/22 1455 6     Pain Loc --      Pain Edu? --      Excl. in [place]? --    No data found.  Updated Vital Signs BP (!) 157/89 (BP Location: Left Arm)   Pulse 79   Temp 98.1 F (36.7 C) (Oral)   Resp 17   SpO2 92%   Visual Acuity Right Eye Distance:   Left Eye Distance:   Bilateral Distance:    Right Eye Near:   Left Eye Near:    Bilateral Near:     Physical Exam Vitals reviewed.  Constitutional:      General: He is not in acute distress.    Appearance: He is not ill-appearing, toxic-appearing or diaphoretic.  HENT:     Mouth/Throat:     Comments: There are broken teeth  and carious teeth.  There is some swelling of his right lower dental ridge and also  some of his left upper dental ridge. Cardiovascular:     Rate and Rhythm: Normal rate and regular rhythm.     Heart sounds: No murmur heard. Pulmonary:     Effort: Pulmonary effort is normal.     Breath sounds: Normal breath sounds.  Musculoskeletal:     Cervical back: Neck supple.  Lymphadenopathy:     Cervical: No cervical adenopathy.  Skin:    Coloration: Skin is not jaundiced or pale.  Neurological:     General: No focal deficit present.     Mental Status: He is alert and oriented to person, place, and time.  Psychiatric:        Behavior: Behavior normal.      UC Treatments / Results  Labs (all labs ordered are listed, but only abnormal results are displayed) Labs Reviewed - No data to display  EKG   Radiology No results found.  Procedures Procedures (including critical care time)  Medications Ordered in UC Medications  ketorolac (TORADOL) 30 MG/ML injection 30 mg (has no administration in time range)    Initial Impression / Assessment and Plan / UC Course  I have reviewed the triage vital signs and the nursing notes.  Pertinent labs & imaging results that were available during my care of the patient were reviewed by me and considered in my medical decision making (see chart for details).      eGFR has been normal in Epic, 108 when last done  He is given a shot of Toradol here and 800 mg ibuprofens are sent in.  Also very small quantity of hydrocodone 7.5's are sent in.  That we had diarrhea on the Augmentin he is agreeable to taking it again after we discussed that I think it is the best one to use for his dental infection. Final Clinical Impressions(s) / UC Diagnoses   Final diagnoses:  Dental infection     Discharge Instructions      Take amoxicillin-clavulanate 875 mg--1 tab twice daily with food for 7 days  Take ibuprofen 800 mg--1 tab every 8 hours as needed  for pain.  Hydrocodone 7.5 mg--1 tablet every 6 hours as needed for pain.  This is best taken with food.  It can cause sleepiness or dizziness      ED Prescriptions     Medication Sig Dispense Auth. Provider   ibuprofen (ADVIL) 800 MG tablet Take 1 tablet (800 mg total) by mouth every 8 (eight) hours as needed (pain). 21 tablet Barrett Henle, MD   amoxicillin-clavulanate (AUGMENTIN) 875-125 MG tablet Take 1 tablet by mouth 2 (two) times daily for 7 days. 14 tablet Lulabelle Desta, Gwenlyn Perking, MD   HYDROcodone-acetaminophen (NORCO) 7.5-325 MG tablet Take 1 tablet by mouth every 6 (six) hours as needed (pain). 12 tablet Zarion Oliff, Gwenlyn Perking, MD      I have reviewed the PDMP during this encounter.   Barrett Henle, MD 09/30/22 [phone]

## 2022-12-09 ENCOUNTER — Other Ambulatory Visit: Payer: Self-pay | Admitting: Family Medicine

## 2022-12-09 DIAGNOSIS — I10 Essential (primary) hypertension: Secondary | ICD-10-CM

## 2022-12-24 ENCOUNTER — Other Ambulatory Visit: Payer: Self-pay | Admitting: Family Medicine

## 2022-12-24 NOTE — Telephone Encounter (Signed)
Requested medication (s) are due for refill today:   Yes  Requested medication (s) are on the active medication list:   Yes  Future visit scheduled:   No.   Did not show up for his last 2 appts.    Last ordered: 03/12/2022 #30, 1 refill  Returned for provider review since he was a No Show for his last 2 appts.      Requested Prescriptions  Pending Prescriptions Disp Refills   mirtazapine (REMERON) 15 MG tablet [Pharmacy Med Name: Mirtazapine 15 MG Oral Tablet] 30 tablet 0    Sig: TAKE 1 TABLET BY MOUTH AT BEDTIME     Psychiatry: Antidepressants - mirtazapine Failed - 12/24/2022  3:05 AM      Failed - Valid encounter within last 6 months    Recent Outpatient Visits           9 months ago Type 2 diabetes mellitus without complication, without long-term current use of insulin (HCC)   [place] [facility], MD   1 year ago Insomnia, unspecified type   [place] [facility], Lauris Poag, MD   1 year ago Type 2 diabetes mellitus without complication, without long-term current use of insulin [facility])   [place] [facility], Lauris Poag, MD   2 years ago Type 2 diabetes mellitus without complication, without long-term current use of insulin [facility])    [facility] at [facility], Kandee Keen, MD   2 years ago Type 2 diabetes mellitus without complication, without long-term current use of insulin [facility])    [facility] at [facility], Kandee Keen, MD

## 2023-01-07 ENCOUNTER — Other Ambulatory Visit: Payer: Self-pay | Admitting: Family Medicine

## 2023-01-23 NOTE — Telephone Encounter (Signed)
Schedule appointment?

## 2023-01-27 NOTE — Telephone Encounter (Signed)
Complete

## 2023-02-10 ENCOUNTER — Encounter: Payer: Self-pay | Admitting: Family Medicine

## 2023-02-10 ENCOUNTER — Ambulatory Visit (INDEPENDENT_AMBULATORY_CARE_PROVIDER_SITE_OTHER): Payer: Medicaid Other | Admitting: Family Medicine

## 2023-02-10 VITALS — BP 136/77 | HR 70 | Temp 98.1°F | Resp 16 | Wt 190.0 lb

## 2023-02-10 DIAGNOSIS — G8929 Other chronic pain: Secondary | ICD-10-CM

## 2023-02-10 DIAGNOSIS — K089 Disorder of teeth and supporting structures, unspecified: Secondary | ICD-10-CM | POA: Diagnosis not present

## 2023-02-10 DIAGNOSIS — E119 Type 2 diabetes mellitus without complications: Secondary | ICD-10-CM

## 2023-02-10 DIAGNOSIS — Z7984 Long term (current) use of oral hypoglycemic drugs: Secondary | ICD-10-CM

## 2023-02-10 DIAGNOSIS — G4700 Insomnia, unspecified: Secondary | ICD-10-CM

## 2023-02-10 LAB — POCT GLYCOSYLATED HEMOGLOBIN (HGB A1C): Hemoglobin A1C: 6.9 % — AB (ref 4.0–5.6)

## 2023-02-10 MED ORDER — AMOXICILLIN 875 MG PO TABS: 875.0000 mg | ORAL_TABLET | Freq: Two times a day (BID) | ORAL | 0 refills | Status: AC

## 2023-02-10 MED ORDER — QUETIAPINE FUMARATE 50 MG PO TABS: 50.0000 mg | ORAL_TABLET | Freq: Every day | ORAL | 1 refills | Status: DC

## 2023-02-10 NOTE — Progress Notes (Unsigned)
New Patient Office Visit  Subjective    Patient ID: Jose Deleon, male    DOB: [birth year]  Age: 62 y.o. MRN: [id number]  CC:  Chief Complaint  Patient presents with   Follow-up    HPI Jose Deleon presents to establish care ***  Outpatient Encounter Medications as of 02/10/2023  Medication Sig   amitriptyline (ELAVIL) 25 MG tablet Take 1 tablet (25 mg total) by mouth at bedtime as needed for sleep.   amLODipine-benazepril (LOTREL) 5-20 MG capsule Take 1 capsule by mouth once daily   Apple Cider Vinegar 300 MG TABS Take by mouth every morning.   aspirin 81 MG tablet Take 1 tablet (81 mg total) by mouth daily.   Blood Glucose Monitoring Suppl (TRUERESULT BLOOD GLUCOSE) w/Device KIT 1 each by Does not apply route daily.   ciclopirox (PENLAC) 8 % solution Apply topically at bedtime. Apply over nail and surrounding skin. Apply daily over previous coat. After seven (7) days, may remove with alcohol and continue cycle.   Cinnamon 500 MG TABS Take by mouth.   Garlic 100 MG TABS Take by mouth every morning.   glucose blood (TRUETEST TEST) test strip 1 each by Other route 2 (two) times daily. Use as instructed   HYDROcodone-acetaminophen (NORCO) 7.5-325 MG tablet Take 1 tablet by mouth every 6 (six) hours as needed (pain).   ibuprofen (ADVIL) 800 MG tablet Take 1 tablet (800 mg total) by mouth every 8 (eight) hours as needed (pain).   ketoconazole (NIZORAL) 2 % cream Apply 1 Application topically daily.   metFORMIN (GLUCOPHAGE) 1000 MG tablet TAKE 1 TABLET BY MOUTH TWICE DAILY WITH A MEAL   sildenafil (VIAGRA) 100 MG tablet Take 0.5-1 tablets (50-100 mg total) by mouth daily as needed for erectile dysfunction.   [DISCONTINUED] atorvastatin (LIPITOR) 80 MG tablet Take 1 tablet (80 mg total) by mouth daily. To lower cholesterol   [DISCONTINUED] cyclobenzaprine (FLEXERIL) 10 MG tablet Take 1 tablet (10 mg total) by mouth 2 (two) times daily as needed for muscle spasms.   [DISCONTINUED]  gabapentin (NEURONTIN) 300 MG capsule Take 1 capsule (300 mg total) by mouth daily after supper.   [DISCONTINUED] mirtazapine (REMERON) 15 MG tablet Take 1 tablet (15 mg total) by mouth at bedtime.   No facility-administered encounter medications on file as of 02/10/2023.    Past Medical History:  Diagnosis Date   FH: heart disease    GERD (gastroesophageal reflux disease)    High cholesterol    History of gout    "reaction from the TB RX"   Hypertension 2015   TB (pulmonary tuberculosis) 1990   Type II diabetes mellitus (HCC) dx ~ 2015    Past Surgical History:  Procedure Laterality Date   CARDIAC CATHETERIZATION N/A 03/13/2016   Procedure: Left Heart Cath and Coronary Angiography;  Surgeon: Peter M Swazil[place], MD;  Location: [facility];  Service: Cardiovascular;  Laterality: N/A;   HEMORRHOID SURGERY  1981   "lanced"    Family History  Problem Relation Age of Onset   Hypertension Mother    Heart attack Mother 53   Hypertension Father 63   Multiple sclerosis Father    Stroke Maternal Grandmother     Social History   Socioeconomic History   Marital status: Single    Spouse name: Not on file   Number of children: Not on file   Years of education: Not on file   Highest education level: Not on file  Occupational History  Occupation: Retired Editor, 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  Tobacco Use   Smoking status: Light Smoker    Current packs/day: 0.00    Average packs/day: 0.3 packs/day for 30.0 years (9.9 ttl pk-yrs)    Types: Cigarettes    Start date: 03/07/1986    Last attempt to quit: 03/07/2016    Years since quitting: 6.9   Smokeless tobacco: Never   Tobacco comments:    1-2 cigs a day  Substance and Sexual Activity   Alcohol use: Yes    Alcohol/week: 4.0 standard drinks of alcohol    Types: 4 Cans of beer per week   Drug use: No   Sexual activity: Not Currently  Other Topics Concern   Not on file  Social History Narrative   Not on file   Social Determinants of Health    Financial Resource Strain: Not on file  Food Insecurity: Not on file  Transportation Needs: Not on file  Physical Activity: Not on file  Stress: Not on file  Social Connections: Not on file  Intimate Partner Violence: Not on file    ROS      Objective    BP 136/77   Pulse 70   Temp 98.1 F (36.7 C) (Oral)   Resp 16   Wt 190 lb (86.2 kg)   SpO2 94%   BMI 23.75 kg/m   Physical Exam  {Labs (Optional):23779}    Assessment & Plan:   Problem List Items Addressed This Visit       Endocrine   Type 2 diabetes mellitus without complication, without long-term current use of insulin (HCC) - Primary   Relevant Orders   POCT glycosylated hemoglobin (Hb A1C) (Completed)   CMP14+EGFR   Urine Albumin/Creatinine with ratio (send out) [LAB689]    No follow-ups on file.   Tommie Raymond, MD

## 2023-02-10 NOTE — Progress Notes (Unsigned)
Patient is here for their 6 month follow-up Patient has no concerns today Care gaps have been discussed with patient  

## 2023-02-12 ENCOUNTER — Encounter: Payer: Self-pay | Admitting: Family Medicine

## 2023-02-12 LAB — SPECIMEN STATUS REPORT

## 2023-02-12 LAB — LIPID PANEL
Chol/HDL Ratio: 2.4 ratio (ref 0.0–5.0)
HDL: 82 mg/dL (ref >39)
Triglycerides: 178 mg/dL — ABNORMAL HIGH (ref 0–149)
VLDL Cholesterol Cal: 29 mg/dL (ref 5–40)

## 2023-02-12 LAB — CMP14+EGFR
ALT: 22 IU/L (ref 0–44)
AST: 18 IU/L (ref 0–40)
Alkaline Phosphatase: 91 IU/L (ref 44–121)
BUN/Creatinine Ratio: 12 (ref 10–24)
Bilirubin Total: 0.3 mg/dL (ref 0.0–1.2)
CO2: 25 mmol/L (ref 20–29)
Chloride: 104 mmol/L (ref 96–106)
Globulin, Total: 2.7 g/dL (ref 1.5–4.5)
Glucose: 77 mg/dL (ref 70–99)
Potassium: 4.1 mmol/L (ref 3.5–5.2)
Sodium: 141 mmol/L (ref 134–144)
Total Protein: 7.1 g/dL (ref 6.0–8.5)
eGFR: 98 mL/min/{1.73_m2} (ref >59)

## 2023-02-17 ENCOUNTER — Other Ambulatory Visit: Payer: Self-pay | Admitting: Family Medicine

## 2023-02-17 DIAGNOSIS — I10 Essential (primary) hypertension: Secondary | ICD-10-CM

## 2023-02-17 DIAGNOSIS — E119 Type 2 diabetes mellitus without complications: Secondary | ICD-10-CM

## 2023-02-17 MED ORDER — AMLODIPINE BESY-BENAZEPRIL HCL 5-20 MG PO CAPS: 1.0000 | ORAL_CAPSULE | Freq: Every day | ORAL | 0 refills | Status: DC

## 2023-02-17 MED ORDER — METFORMIN HCL 1000 MG PO TABS: 1000.0000 mg | ORAL_TABLET | Freq: Two times a day (BID) | ORAL | 0 refills | Status: DC

## 2023-03-06 ENCOUNTER — Encounter: Payer: Self-pay | Admitting: Family Medicine

## 2023-03-06 ENCOUNTER — Telehealth (INDEPENDENT_AMBULATORY_CARE_PROVIDER_SITE_OTHER): Payer: Medicaid Other | Admitting: Family Medicine

## 2023-03-06 DIAGNOSIS — U071 COVID-19: Secondary | ICD-10-CM

## 2023-03-06 MED ORDER — NIRMATRELVIR/RITONAVIR (PAXLOVID)TABLET: 3.0000 | ORAL_TABLET | Freq: Two times a day (BID) | ORAL | 0 refills | Status: AC

## 2023-03-06 NOTE — Addendum Note (Signed)
Addended by: Kieth Brightly on: 03/06/2023 09:37 AM   Modules accepted: Orders

## 2023-03-06 NOTE — Progress Notes (Signed)
Virtual Visit via Video Note  I connected with Clemens Catholic on 03/06/23 at  8:40 AM EDT by a video enabled telemedicine application and verified that I am speaking with the correct person using two identifiers.  Location: Patient: [place] Provider: [place]   I discussed the limitations of evaluation and management by telemedicine and the availability of in person appointments. The patient expressed understanding and agreed to proceed.  History of Present Illness: Patient reports waking up feeling ill on yesterday and on today feeling even worse with flu like sx. He took a home covid test which he reports had a faint positive. He was exposed to a young neighbor recently that has been diagnosed with covid.    Observations/Objective:   Assessment and Plan: 1. Positive self-administered antigen test for COVID-19 Covid sx with faint positive home test. Will retest. Paxlovid prescribed   Follow Up Instructions:    I discussed the assessment and treatment plan with the patient. The patient was provided an opportunity to ask questions and all were answered. The patient agreed with the plan and demonstrated an understanding of the instructions.   The patient was advised to call back or seek an in-person evaluation if the symptoms worsen or if the condition fails to improve as anticipated.  I provided 5 minutes of non-face-to-face time during this encounter.   Tommie Raymond, MD

## 2023-03-07 LAB — COVID-19, FLU A+B AND RSV
Influenza A, NAA: NOT DETECTED
Influenza B, NAA: NOT DETECTED
RSV, NAA: NOT DETECTED
SARS-CoV-2, NAA: DETECTED — AB

## 2023-03-28 ENCOUNTER — Other Ambulatory Visit: Payer: Self-pay | Admitting: Family Medicine

## 2023-03-28 DIAGNOSIS — N522 Drug-induced erectile dysfunction: Secondary | ICD-10-CM

## 2023-06-03 ENCOUNTER — Other Ambulatory Visit: Payer: Self-pay | Admitting: Family Medicine

## 2023-06-07 ENCOUNTER — Other Ambulatory Visit: Payer: Self-pay | Admitting: Podiatry

## 2023-06-07 ENCOUNTER — Other Ambulatory Visit: Payer: Self-pay | Admitting: Family Medicine

## 2023-06-07 DIAGNOSIS — N522 Drug-induced erectile dysfunction: Secondary | ICD-10-CM

## 2023-06-07 DIAGNOSIS — I10 Essential (primary) hypertension: Secondary | ICD-10-CM

## 2023-06-07 MED ORDER — KETOCONAZOLE 2 % EX CREA: 1.0000 | TOPICAL_CREAM | Freq: Every day | CUTANEOUS | 0 refills | Status: AC

## 2023-06-09 ENCOUNTER — Other Ambulatory Visit: Payer: Self-pay | Admitting: Family Medicine

## 2023-06-09 DIAGNOSIS — I10 Essential (primary) hypertension: Secondary | ICD-10-CM

## 2023-06-12 MED ORDER — SILDENAFIL CITRATE 100 MG PO TABS: 100.0000 mg | ORAL_TABLET | ORAL | 0 refills | Status: DC | PRN

## 2023-06-15 ENCOUNTER — Other Ambulatory Visit: Payer: Self-pay | Admitting: *Deleted

## 2023-06-15 NOTE — Telephone Encounter (Signed)
QUEtiapine (SEROQUEL) 50 MG tablet

## 2023-06-15 NOTE — Telephone Encounter (Signed)
Patient request

## 2023-06-16 NOTE — Telephone Encounter (Signed)
Patient is requesting refill

## 2023-06-17 ENCOUNTER — Other Ambulatory Visit: Payer: Self-pay | Admitting: Emergency Medicine

## 2023-06-17 DIAGNOSIS — G4700 Insomnia, unspecified: Secondary | ICD-10-CM

## 2023-06-17 MED ORDER — QUETIAPINE FUMARATE 50 MG PO TABS: 50.0000 mg | ORAL_TABLET | Freq: Every day | ORAL | 1 refills | Status: DC

## 2023-07-02 ENCOUNTER — Encounter: Payer: Self-pay | Admitting: Podiatry

## 2023-07-02 ENCOUNTER — Ambulatory Visit (INDEPENDENT_AMBULATORY_CARE_PROVIDER_SITE_OTHER): Payer: Medicaid Other

## 2023-07-02 ENCOUNTER — Ambulatory Visit (INDEPENDENT_AMBULATORY_CARE_PROVIDER_SITE_OTHER): Payer: Medicaid Other | Admitting: Podiatry

## 2023-07-02 DIAGNOSIS — M7751 Other enthesopathy of right foot: Secondary | ICD-10-CM

## 2023-07-02 DIAGNOSIS — M2011 Hallux valgus (acquired), right foot: Secondary | ICD-10-CM

## 2023-07-02 DIAGNOSIS — L84 Corns and callosities: Secondary | ICD-10-CM

## 2023-07-02 DIAGNOSIS — M778 Other enthesopathies, not elsewhere classified: Secondary | ICD-10-CM

## 2023-07-02 NOTE — Progress Notes (Signed)
  Subjective:  Patient ID: Jose Deleon, male    DOB: 01-02-1961,  MRN: [id number]  Chief Complaint  Patient presents with   Foot Pain    Patient states he can blearily stand a sock touching his right foot , patient says it was getting better then recently. His bunion on right has pushed his right hallux over . It has been really sore and he feels like it may be infected  doctor gave patient medication for pain but he states it does nothing for the pain in his foot.     62 y.o. male presents with concern for pain great toe.  He has an area of callus present on the right hallux.  Concerned about bunion pain on the right foot.  Past Medical History:  Diagnosis Date   FH: heart disease    GERD (gastroesophageal reflux disease)    High cholesterol    History of gout    "reaction from the TB RX"   Hypertension 2015   TB (pulmonary tuberculosis) 1990   Type II diabetes mellitus (HCC) dx ~ 2015    No Known Allergies  ROS: Negative except as per HPI above  Objective:  General: AAO x3, NAD  Dermatological: Hyperkeratotic lesion present at the lateral aspect of the right hallux IPJ area.  This is related to the pressure between the first and second toes from HAV deformity  Vascular:  Dorsalis Pedis artery and Posterior Tibial artery pedal pulses are 2/4 bilateral.  Capillary fill time < 3 sec to all digits.   Neruologic: Grossly intact via light touch bilateral. Protective threshold intact to all sites bilateral.   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  Enlarged medial eminence of the first metatarsal head.  Increased first intermetatarsal angle.  Increased hallux valgus angle.  Right hallux abuts the second toe.  Decreased range of motion of the first MPJ.  Gait: Unassisted, Nonantalgic.   No images are attached to the encounter.  Radiographs:  Date: 07/02/2023 XR the right foot Weightbearing AP/Lateral/Oblique   Findings: Increased first  intermetatarsal angle.  Osseous prominence of the first metatarsal head medially hallux valgus angle is increased.  Abnormal tibial sesamoid position. Assessment:   1. Hav (hallux abducto valgus), right   2. Callus of toe   3. Capsulitis of foot      Plan:  Patient was evaluated and treated and all questions answered.  # Hallux abductovalgus right foot -Discussed with patient his pain is primarily related to his bunion deformity with the right hallux pressing into the second toe.  This is causing hyperkeratotic lesion formed.  -Discussed surgical versus conservative care.  Conservatively could proceed with toe spacer which we will do for now.  He tried 1 in the office that it did help by providing pressure between the hallux and second toe. -Do not see evidence of infection or ulceration on the right hallux at this time -Surgically I would recommend bunionectomy with distal first metatarsal osteotomy via minimally invasive approach.  Discussed the risk benefits alternatives possible complications as well as the associated recovery time associate with this procedure.  Patient wished to proceed.  Informed surgical consent was obtained this visit we will begin surgical planning           Corinna Gab, DPM [facility] / [facility]

## 2023-07-10 ENCOUNTER — Other Ambulatory Visit: Payer: Self-pay | Admitting: Family Medicine

## 2023-07-10 DIAGNOSIS — N522 Drug-induced erectile dysfunction: Secondary | ICD-10-CM

## 2023-07-13 ENCOUNTER — Telehealth: Payer: Self-pay

## 2023-07-13 ENCOUNTER — Other Ambulatory Visit: Payer: Self-pay | Admitting: Family Medicine

## 2023-07-13 DIAGNOSIS — N522 Drug-induced erectile dysfunction: Secondary | ICD-10-CM

## 2023-07-13 NOTE — Telephone Encounter (Signed)
Medication Refill -  Most Recent Primary Care Visit:  Provider: Georganna Skeans  Department: [facility]  Visit Type: MYCHART VIDEO VISIT  Date: 03/06/2023  Medication: sildenafil (VIAGRA) 100 MG tablet   Has the patient contacted their pharmacy? Yes (Agent: If no, request that the patient contact the pharmacy for the refill. If patient does not wish to contact the pharmacy document the reason why and proceed with request.) (Agent: If yes, when and what did the pharmacy advise?)  Is this the correct pharmacy for this prescription? Yes If no, delete pharmacy and type the correct one.  This is the patient's preferred pharmacy: [facility] [address] [place], [place] - [address] [place] [address] Phone: [phone] Fax: [phone]  Has the prescription been filled recently? Yes  Is the patient out of the medication? Yes  Has the patient been seen for an appointment in the last year OR does the patient have an upcoming appointment? Yes  Can we respond through MyChart? Yes  Agent: Please be advised that Rx refills may take up to 3 business days. We ask that you follow-up with your pharmacy.

## 2023-07-13 NOTE — Telephone Encounter (Signed)
Received surgery paperwork from Dr. Annamary Rummage. Spoke to [place] to get surgery scheduled, however he stated he needed to call me back.

## 2023-07-14 NOTE — Telephone Encounter (Signed)
Requested Prescriptions  Pending Prescriptions Disp Refills   sildenafil (VIAGRA) 100 MG tablet 30 tablet 0    Sig: Take 1 tablet (100 mg total) by mouth as needed for erectile dysfunction. TAKE 1/2 TO 1 (ONE-HALF TO ONE) TABLET BY MOUTH ONCE DAILY AS NEEDED FOR ERECTILE DYSFUNCTION     Urology: Erectile Dysfunction Agents Passed - 07/14/2023  1:28 PM      Passed - AST in normal range and within 360 days    AST  Date Value Ref Range Status  02/10/2023 18 0 - 40 IU/L Final         Passed - ALT in normal range and within 360 days    ALT  Date Value Ref Range Status  02/10/2023 22 0 - 44 IU/L Final         Passed - Last BP in normal range    BP Readings from Last 1 Encounters:  02/10/23 136/77         Passed - Valid encounter within last 12 months    Recent Outpatient Visits           4 months ago Positive self-administered antigen test for COVID-19   [facility], Lauris Poag, MD   5 months ago Type 2 diabetes mellitus without complication, without long-term current use of insulin (HCC)   [place] [facility] at [facility], Lauris Poag, MD   1 year ago Type 2 diabetes mellitus without complication, without long-term current use of insulin (HCC)   [place] [facility] at [facility], MD   1 year ago Insomnia, unspecified type   [place] [facility] at [facility], Lauris Poag, MD   1 year ago Type 2 diabetes mellitus without complication, without long-term current use of insulin [facility])   [place] [facility] at [facility], MD

## 2023-08-06 ENCOUNTER — Telehealth: Payer: Self-pay | Admitting: Podiatry

## 2023-08-06 NOTE — Telephone Encounter (Signed)
Spoke with pt to let him know that we need his updated insurance information to get his surgery authorization started, pt stated that he would call back with the new information once he gets home.

## 2023-08-11 ENCOUNTER — Telehealth: Payer: Self-pay | Admitting: Podiatry

## 2023-08-11 NOTE — Telephone Encounter (Signed)
Called and left pt a message to call back with updated insurance information/ need new card, in order to begin authorization for upcoming 2/12 surgery.

## 2023-08-12 ENCOUNTER — Telehealth: Payer: Self-pay | Admitting: Podiatry

## 2023-08-12 NOTE — Telephone Encounter (Signed)
Spoke with pt and he stated that he has to reschedule his 09/02/23 sx with Dr.Standiford @ [facility], he stated that he was unaware of the 2/12 date and would like to reschedule due to not being able to get time off from work. He would like to find a new date for surgery, hopefully in March. The best number to reach him back at is [phone].

## 2023-08-25 ENCOUNTER — Other Ambulatory Visit: Payer: Medicaid Other

## 2023-08-26 ENCOUNTER — Telehealth: Payer: Self-pay | Admitting: Podiatry

## 2023-08-26 NOTE — Telephone Encounter (Signed)
 Called pt back regarding a referral for a second opinion. Pt stated honestly he wants a second opinion before having surgery and I told him I understood. I told the pt that the provider doesn't require a referral and his insurance does not require a referral. I suggested the pt call the office that he is wanting to go to for a second opinion and that they may want his records from our office, which they would have him fill out and sign a medical records release form to obtain those. I wished the pt the best and told him I hope everything works out for him.

## 2023-08-26 NOTE — Telephone Encounter (Signed)
 I'm a pt there and I have been, its been suggested that I have surgery. Before I complete the pre-op to do the surgery, I need to get a referral for a second opinion. If you could give me a call back, I would certainly appreciate it. Thank you.

## 2023-09-01 ENCOUNTER — Other Ambulatory Visit: Payer: Self-pay | Admitting: Emergency Medicine

## 2023-09-01 ENCOUNTER — Other Ambulatory Visit: Payer: Self-pay | Admitting: Family Medicine

## 2023-09-01 ENCOUNTER — Telehealth: Payer: Self-pay | Admitting: Family Medicine

## 2023-09-01 ENCOUNTER — Telehealth: Payer: Self-pay | Admitting: Emergency Medicine

## 2023-09-01 DIAGNOSIS — I10 Essential (primary) hypertension: Secondary | ICD-10-CM

## 2023-09-01 DIAGNOSIS — E119 Type 2 diabetes mellitus without complications: Secondary | ICD-10-CM

## 2023-09-01 MED ORDER — METFORMIN HCL 1000 MG PO TABS: 1000.0000 mg | ORAL_TABLET | Freq: Two times a day (BID) | ORAL | 0 refills | Status: DC

## 2023-09-01 MED ORDER — AMLODIPINE BESY-BENAZEPRIL HCL 5-20 MG PO CAPS: 1.0000 | ORAL_CAPSULE | Freq: Every day | ORAL | 0 refills | Status: DC

## 2023-09-01 NOTE — Telephone Encounter (Signed)
Called patient to schedule appointment to be seen by provider patient stated he would go on mychart and schedule appointment and didn't want me to do .

## 2023-09-01 NOTE — Telephone Encounter (Signed)
I called patient to make him aware that I sent in  a 30 day supply of metformin however he needs a office visit.  Patient stated "he is at  a MD appointment and will call me back."

## 2023-09-01 NOTE — Telephone Encounter (Signed)
error 

## 2023-09-08 ENCOUNTER — Encounter: Payer: Medicaid Other | Admitting: Podiatry

## 2023-09-10 ENCOUNTER — Telehealth: Payer: Self-pay | Admitting: Podiatry

## 2023-09-10 NOTE — Telephone Encounter (Signed)
DOS-10/14/23  Jose Deleon [id number] [place] BUNIONECTOMY RT- [id number]  [facility] EFFECTIVE DATE-06/21/23  SPOKE WITH JESSICA M FROM [facility] MEDICAID AND SHE STATED THAT PRIOR AUTH IS NOT REQUIRED FOR CPT CODES 82956 AND [phone].  CALL REF #: [id number]

## 2023-09-14 ENCOUNTER — Other Ambulatory Visit: Payer: Self-pay | Admitting: Family Medicine

## 2023-09-14 DIAGNOSIS — N522 Drug-induced erectile dysfunction: Secondary | ICD-10-CM

## 2023-09-17 ENCOUNTER — Other Ambulatory Visit: Payer: Self-pay | Admitting: Family Medicine

## 2023-09-17 DIAGNOSIS — N522 Drug-induced erectile dysfunction: Secondary | ICD-10-CM

## 2023-09-17 NOTE — Telephone Encounter (Signed)
 Copied from CRM [id number]. Topic: Clinical - Medication Refill >> Sep 17, 2023 11:29 AM Gildardo Pounds wrote: Most Recent Primary Care Visit:  Provider: Georganna Skeans  Department: [facility]  Visit Type: MYCHART VIDEO VISIT  Date: 03/06/2023  Medication: sildenafil (VIAGRA) 100 MG tablet  Has the patient contacted their pharmacy? Yes (Agent: If no, request that the patient contact the pharmacy for the refill. If patient does not wish to contact the pharmacy document the reason why and proceed with request.) (Agent: If yes, when and what did the pharmacy advise?)  Is this the correct pharmacy for this prescription? Yes If no, delete pharmacy and type the correct one.  This is the patient's preferred pharmacy:   [facility] [address] [place], [place] - [address] [place] [address] Phone: [phone] Fax: [phone]   Has the prescription been filled recently? No  Is the patient out of the medication? Yes  Has the patient been seen for an appointment in the last year OR does the patient have an upcoming appointment? Yes  Can we respond through MyChart? No  Agent: Please be advised that Rx refills may take up to 3 business days. We ask that you follow-up with your pharmacy.

## 2023-09-18 NOTE — Telephone Encounter (Signed)
 Requested medication (s) are due for refill today:   Yes  Requested medication (s) are on the active medication list:   Yes this is a duplicate request  Future visit scheduled:   No    Last ordered: 09/14/2023 #30, 0 refills  It does not look like this was sent to the pharmacy.   It was pended on 09/14/2023.  Dispensing amount and dose being requested and refills by pharmacy.      Requested Prescriptions  Pending Prescriptions Disp Refills   sildenafil (VIAGRA) 100 MG tablet 30 tablet 0     Urology: Erectile Dysfunction Agents Passed - 09/18/2023 12:32 PM      Passed - AST in normal range and within 360 days    AST  Date Value Ref Range Status  02/10/2023 18 0 - 40 IU/L Final         Passed - ALT in normal range and within 360 days    ALT  Date Value Ref Range Status  02/10/2023 22 0 - 44 IU/L Final         Passed - Last BP in normal range    BP Readings from Last 1 Encounters:  02/10/23 136/77         Passed - Valid encounter within last 12 months    Recent Outpatient Visits           6 months ago Positive self-administered antigen test for COVID-19   [facility], Lauris Poag, MD   7 months ago Type 2 diabetes mellitus without complication, without long-term current use of insulin (HCC)   [place] [facility] at [facility], Lauris Poag, MD   1 year ago Type 2 diabetes mellitus without complication, without long-term current use of insulin (HCC)   [place] [facility] at [facility], MD   2 years ago Insomnia, unspecified type   [place] [facility] at [facility], Lauris Poag, MD   2 years ago Type 2 diabetes mellitus without complication, without long-term current use of insulin [facility])   [place] [facility] at [facility], MD

## 2023-09-20 ENCOUNTER — Other Ambulatory Visit: Payer: Self-pay | Admitting: Family Medicine

## 2023-09-20 DIAGNOSIS — N522 Drug-induced erectile dysfunction: Secondary | ICD-10-CM

## 2023-09-21 ENCOUNTER — Telehealth: Payer: Self-pay | Admitting: Family Medicine

## 2023-09-21 NOTE — Telephone Encounter (Deleted)
 Pt calling for Viagra refill. Please advise

## 2023-09-21 NOTE — Telephone Encounter (Signed)
 Pt requesting Viagra refill. Please advise

## 2023-09-23 ENCOUNTER — Telehealth: Payer: Self-pay | Admitting: Family Medicine

## 2023-09-23 MED ORDER — SILDENAFIL CITRATE 100 MG PO TABS: 100.0000 mg | ORAL_TABLET | ORAL | 0 refills | Status: DC | PRN

## 2023-09-23 NOTE — Telephone Encounter (Signed)
 Patient needs refill for Viagra he has been waiting for refill

## 2023-09-23 NOTE — Telephone Encounter (Signed)
 Pt states he has called several times for this refill of Viagra. I made pt an appt, next available 4/11. However, pt is very upset no one has called him to let him know why he has had to call so many times w/out a call back.  Pt hopes this can be refilled as soon as possible. He is not happy.

## 2023-09-30 ENCOUNTER — Other Ambulatory Visit: Payer: Self-pay | Admitting: Family Medicine

## 2023-09-30 DIAGNOSIS — G4700 Insomnia, unspecified: Secondary | ICD-10-CM

## 2023-09-30 NOTE — Telephone Encounter (Signed)
 error

## 2023-10-05 ENCOUNTER — Other Ambulatory Visit: Payer: Self-pay | Admitting: Family Medicine

## 2023-10-05 DIAGNOSIS — G4700 Insomnia, unspecified: Secondary | ICD-10-CM

## 2023-10-05 DIAGNOSIS — E119 Type 2 diabetes mellitus without complications: Secondary | ICD-10-CM

## 2023-10-05 DIAGNOSIS — I10 Essential (primary) hypertension: Secondary | ICD-10-CM

## 2023-10-05 NOTE — Telephone Encounter (Unsigned)
 Copied from CRM [id number]. Topic: Appointments - Scheduling Inquiry for Clinic >> Oct 05, 2023  2:26 PM Elle L wrote: Reason for CRM: The patient is requesting to see if he could get a surgical clearance for a minor foot surgery during his appointment on 4/4. The patient's call back number is [phone].

## 2023-10-05 NOTE — Telephone Encounter (Signed)
 Copied from CRM [id number]. Topic: Clinical - Medication Refill >> Oct 05, 2023  2:23 PM Elle L wrote: Most Recent Primary Care Visit:  Provider: Georganna Skeans  Department: [facility]  Visit Type: MYCHART VIDEO VISIT  Date: 03/06/2023  Medication: metFORMIN (GLUCOPHAGE) 1000 MG tablet AND QUEtiapine (SEROQUEL) 50 MG tablet AND amLODipine-benazepril (LOTREL) 5-20 MG capsule   Has the patient contacted their pharmacy? Yes  Is this the correct pharmacy for this prescription? Yes If no, delete pharmacy and type the correct one.  This is the patient's preferred pharmacy:  [facility] [address] ([address] [place] - [address] [place] [address] [place] [address] Phone: [phone] Fax: [phone]  Has the prescription been filled recently? No  Is the patient out of the medication? Yes  Has the patient been seen for an appointment in the last year OR does the patient have an upcoming appointment? Yes  Can we respond through MyChart? Yes  Agent: Please be advised that Rx refills may take up to 3 business days. We ask that you follow-up with your pharmacy.

## 2023-10-06 ENCOUNTER — Telehealth: Payer: Self-pay | Admitting: Podiatry

## 2023-10-06 ENCOUNTER — Inpatient Hospital Stay
Admission: RE | Admit: 2023-10-06 | Discharge: 2023-10-06 | Disposition: A | Discharge status: Home / Self Care | Payer: Medicaid Other | Source: Ambulatory Visit

## 2023-10-06 MED ORDER — AMLODIPINE BESY-BENAZEPRIL HCL 5-20 MG PO CAPS: 1.0000 | ORAL_CAPSULE | Freq: Every day | ORAL | 0 refills | Status: AC

## 2023-10-06 MED ORDER — METFORMIN HCL 1000 MG PO TABS: 1000.0000 mg | ORAL_TABLET | Freq: Two times a day (BID) | ORAL | 0 refills | Status: DC

## 2023-10-06 NOTE — Telephone Encounter (Signed)
 Requested medications are due for refill today.  no  Requested medications are on the active medications list.  yes  Last refill. 10/06/2023  Future visit scheduled.   yes  Notes to clinic.  Refill/refusal not delegated.    Requested Prescriptions  Pending Prescriptions Disp Refills   QUEtiapine (SEROQUEL) 50 MG tablet 30 tablet 1    Sig: Take 1 tablet (50 mg total) by mouth at bedtime.     Not Delegated - Psychiatry:  Antipsychotics - Second Generation (Atypical) - quetiapine Failed - 10/06/2023  5:40 PM      Failed - This refill cannot be delegated      Failed - TSH in normal range and within 360 days    TSH  Date Value Ref Range Status  03/11/2016 1.752 0.350 - 4.500 uIU/mL Final         Failed - Valid encounter within last 6 months    Recent Outpatient Visits           7 months ago Positive self-administered antigen test for COVID-19   [place] [facility], [place]   7 months ago Type 2 diabetes mellitus without complication, without long-term current use of insulin (HCC)   [place] [facility], [place]   1 year ago Type 2 diabetes mellitus without complication, without long-term current use of insulin (HCC)   [place] [facility], MD   2 years ago Insomnia, unspecified type   [place] [facility], MD   2 years ago Type 2 diabetes mellitus without complication, without long-term current use of insulin (HCC)    [facility] at [facility], Lauris Poag, [place]              Failed - Lipid Panel in normal range within the last 12 months    Cholesterol, Total  Date Value Ref Range Status  02/10/2023 195 100 - 199 mg/dL Final   LDL Chol Calc (NIH)  Date Value Ref Range Status  02/10/2023 84 0 - 99 mg/dL Final   HDL  Date Value Ref Range Status  02/10/2023 82 >39 mg/dL Final   Triglycerides  Date Value Ref Range  Status  02/10/2023 178 (H) 0 - 149 mg/dL Final         Failed - CBC within normal limits and completed in the last 12 months    WBC  Date Value Ref Range Status  03/23/2020 7.3 3.4 - 10.8 x10E3/uL Final  05/03/2018 9.9 4.0 - 10.5 K/uL Final   RBC  Date Value Ref Range Status  03/23/2020 4.96 4.14 - 5.80 x10E6/uL Final  05/03/2018 4.91 4.22 - 5.81 MIL/uL Final   Hemoglobin  Date Value Ref Range Status  03/23/2020 14.8 13.0 - 17.7 g/dL Final   Hematocrit  Date Value Ref Range Status  03/23/2020 42.6 37.5 - 51.0 % Final   MCHC  Date Value Ref Range Status  03/23/2020 34.7 31.5 - 35.7 g/dL Final  08/65/7846 96.2 30.0 - 36.0 g/dL Final   [facility]  Date Value Ref Range Status  03/23/2020 29.8 26.6 - 33.0 pg Final  05/03/2018 29.9 26.0 - 34.0 pg Final   MCV  Date Value Ref Range Status  03/23/2020 86 79 - 97 fL Final   No results found for: "PLTCOUNTKUC", "LABPLAT", "POCPLA" RDW  Date Value Ref Range Status  03/23/2020 12.3 11.6 - 15.4 % Final  Failed - CMP within normal limits and completed in the last 12 months    Albumin  Date Value Ref Range Status  02/10/2023 4.4 3.9 - 4.9 g/dL Final   Alkaline Phosphatase  Date Value Ref Range Status  02/10/2023 91 44 - 121 IU/L Final   ALT  Date Value Ref Range Status  02/10/2023 22 0 - 44 IU/L Final   AST  Date Value Ref Range Status  02/10/2023 18 0 - 40 IU/L Final   BUN  Date Value Ref Range Status  02/10/2023 10 8 - 27 mg/dL Final   Calcium  Date Value Ref Range Status  02/10/2023 9.2 8.6 - 10.2 mg/dL Final   CO2  Date Value Ref Range Status  02/10/2023 25 20 - 29 mmol/L Final   Creat  Date Value Ref Range Status  10/08/2015 0.98 0.70 - 1.33 mg/dL Final   Creatinine, Ser  Date Value Ref Range Status  02/10/2023 0.83 0.76 - 1.27 mg/dL Final   Creatinine, Urine  Date Value Ref Range Status  09/08/2014 180.7 mg/dL Final    Comment:    No reference range established.   Glucose  Date Value Ref  Range Status  02/10/2023 77 70 - 99 mg/dL Final   Glucose, Bld  Date Value Ref Range Status  05/03/2018 115 (H) 70 - 99 mg/dL Final   POC Glucose  Date Value Ref Range Status  11/21/2020 145 (A) 70 - 99 mg/dl Final   Glucose-Capillary  Date Value Ref Range Status  03/13/2016 158 (H) 65 - 99 mg/dL Final   Potassium  Date Value Ref Range Status  02/10/2023 4.1 3.5 - 5.2 mmol/L Final   Sodium  Date Value Ref Range Status  02/10/2023 141 134 - 144 mmol/L Final   Bilirubin Total  Date Value Ref Range Status  02/10/2023 0.3 0.0 - 1.2 mg/dL Final   Total Protein  Date Value Ref Range Status  02/10/2023 7.1 6.0 - 8.5 g/dL Final   GFR, Est African American  Date Value Ref Range Status  10/08/2015 >89 >=60 mL/min Final   GFR calc Af Amer  Date Value Ref Range Status  03/23/2020 108 >59 mL/min/1.73 Final    Comment:    **[facility] currently reports eGFR in compliance with the current**   recommendations of the [employer]. [facility] will   update reporting as new guidelines are published from the NKF-ASN   Task force.    eGFR  Date Value Ref Range Status  02/10/2023 98 >59 mL/min/1.73 Final   GFR, Est Non African American  Date Value Ref Range Status  10/08/2015 86 >=60 mL/min Final    Comment:      The estimated GFR is a calculation valid for adults (>=46 years old) that uses the CKD-EPI algorithm to adjust for age and sex. It is   not to be used for children, pregnant women, hospitalized patients,    patients on dialysis, or with rapidly changing kidney function. According to the NKDEP, eGFR >89 is normal, 60-89 shows mild impairment, 30-59 shows moderate impairment, 15-29 shows severe impairment and <15 is ESRD.      GFR calc non Af Amer  Date Value Ref Range Status  03/23/2020 93 >59 mL/min/1.73 Final         Passed - Last BP in normal range    BP Readings from Last 1 Encounters:  02/10/23 136/77         Passed - Last Heart Rate in normal  range  Pulse Readings from Last 1 Encounters:  02/10/23 70         Signed Prescriptions Disp Refills   amLODipine-benazepril (LOTREL) 5-20 MG capsule 30 capsule 0    Sig: Take 1 capsule by mouth daily.     Cardiovascular: CCB + ACEI Combos Failed - 10/06/2023  5:40 PM      Failed - Cr in normal range and within 180 days    Creat  Date Value Ref Range Status  10/08/2015 0.98 0.70 - 1.33 mg/dL Final   Creatinine, Ser  Date Value Ref Range Status  02/10/2023 0.83 0.76 - 1.27 mg/dL Final   Creatinine, Urine  Date Value Ref Range Status  09/08/2014 180.7 mg/dL Final    Comment:    No reference range established.         Failed - K in normal range and within 180 days    Potassium  Date Value Ref Range Status  02/10/2023 4.1 3.5 - 5.2 mmol/L Final         Failed - Na in normal range and within 180 days    Sodium  Date Value Ref Range Status  02/10/2023 141 134 - 144 mmol/L Final         Failed - eGFR is 30 or above and within 180 days    GFR, Est African American  Date Value Ref Range Status  10/08/2015 >89 >=60 mL/min Final   GFR calc Af Amer  Date Value Ref Range Status  03/23/2020 108 >59 mL/min/1.73 Final    Comment:    **[facility] currently reports eGFR in compliance with the current**   recommendations of the [employer]. [facility] will   update reporting as new guidelines are published from the NKF-ASN   Task force.    GFR, Est Non African American  Date Value Ref Range Status  10/08/2015 86 >=60 mL/min Final    Comment:      The estimated GFR is a calculation valid for adults (>=62 years old) that uses the CKD-EPI algorithm to adjust for age and sex. It is   not to be used for children, pregnant women, hospitalized patients,    patients on dialysis, or with rapidly changing kidney function. According to the NKDEP, eGFR >89 is normal, 60-89 shows mild impairment, 30-59 shows moderate impairment, 15-29 shows severe impairment and <15 is  ESRD.      GFR calc non Af Amer  Date Value Ref Range Status  03/23/2020 93 >59 mL/min/1.73 Final   eGFR  Date Value Ref Range Status  02/10/2023 98 >59 mL/min/1.73 Final         Failed - Valid encounter within last 6 months    Recent Outpatient Visits           7 months ago Positive self-administered antigen test for COVID-19   [facility], Lauris Poag, [place]   7 months ago Type 2 diabetes mellitus without complication, without long-term current use of insulin (HCC)   [place] [facility], Lauris Poag, MD   1 year ago Type 2 diabetes mellitus without complication, without long-term current use of insulin (HCC)   [place] [facility] at [facility], [place]   2 years ago Insomnia, unspecified type   [place] [facility], Lauris Poag, [place]   2 years ago Type 2 diabetes mellitus without complication, without long-term current use of insulin (HCC)   [place] [facility]  at [facility], [place]              Passed - Patient is not pregnant      Passed - Last BP in normal range    BP Readings from Last 1 Encounters:  02/10/23 136/77          metFORMIN (GLUCOPHAGE) 1000 MG tablet 60 tablet 0    Sig: Take 1 tablet (1,000 mg total) by mouth 2 (two) times daily with a meal.     Endocrinology:  Diabetes - Biguanides Failed - 10/06/2023  5:40 PM      Failed - HBA1C is between 0 and 7.9 and within 180 days    Hemoglobin A1C  Date Value Ref Range Status  02/10/2023 6.9 (A) 4.0 - 5.6 % Final   HbA1c, POC (controlled diabetic range)  Date Value Ref Range Status  11/21/2020 8.0 (A) 0.0 - 7.0 % Final         Failed - B12 Level in normal range and within 720 days    No results found for: "VITAMINB12"       Failed - Valid encounter within last 6 months    Recent Outpatient Visits           7 months ago Positive self-administered antigen test for COVID-19   [facility], [place]   7 months ago Type 2 diabetes mellitus without complication, without long-term current use of insulin (HCC)   [place] [facility], [place]   1 year ago Type 2 diabetes mellitus without complication, without long-term current use of insulin (HCC)   [place] [facility] at [facility], [place]   2 years ago Insomnia, unspecified type   [place] [facility], Lauris Poag, MD   2 years ago Type 2 diabetes mellitus without complication, without long-term current use of insulin (HCC)   [place] [facility] at [facility], Lauris Poag, MD              Failed - CBC within normal limits and completed in the last 12 months    WBC  Date Value Ref Range Status  03/23/2020 7.3 3.4 - 10.8 x10E3/uL Final  05/03/2018 9.9 4.0 - 10.5 K/uL Final   RBC  Date Value Ref Range Status  03/23/2020 4.96 4.14 - 5.80 x10E6/uL Final  05/03/2018 4.91 4.22 - 5.81 MIL/uL Final   Hemoglobin  Date Value Ref Range Status  03/23/2020 14.8 13.0 - 17.7 g/dL Final   Hematocrit  Date Value Ref Range Status  03/23/2020 42.6 37.5 - 51.0 % Final   MCHC  Date Value Ref Range Status  03/23/2020 34.7 31.5 - 35.7 g/dL Final  28/41/3244 01.0 30.0 - 36.0 g/dL Final   [facility]  Date Value Ref Range Status  03/23/2020 29.8 26.6 - 33.0 pg Final  05/03/2018 29.9 26.0 - 34.0 pg Final   MCV  Date Value Ref Range Status  03/23/2020 86 79 - 97 fL Final   No results found for: "PLTCOUNTKUC", "LABPLAT", "POCPLA" RDW  Date Value Ref Range Status  03/23/2020 12.3 11.6 - 15.4 % Final         Passed - Cr in normal range and within 360 days    Creat  Date Value Ref Range Status  10/08/2015 0.98 0.70 - 1.33 mg/dL Final   Creatinine, Ser  Date Value Ref Range Status  02/10/2023 0.83 0.76 -  1.27 mg/dL Final   Creatinine, Urine  Date Value Ref Range Status  09/08/2014 180.7 mg/dL Final     Comment:    No reference range established.         Passed - eGFR in normal range and within 360 days    GFR, Est African American  Date Value Ref Range Status  10/08/2015 >89 >=60 mL/min Final   GFR calc Af Amer  Date Value Ref Range Status  03/23/2020 108 >59 mL/min/1.73 Final    Comment:    **[facility] currently reports eGFR in compliance with the current**   recommendations of the [employer]. [facility] will   update reporting as new guidelines are published from the NKF-ASN   Task force.    GFR, Est Non African American  Date Value Ref Range Status  10/08/2015 86 >=60 mL/min Final    Comment:      The estimated GFR is a calculation valid for adults (>=56 years old) that uses the CKD-EPI algorithm to adjust for age and sex. It is   not to be used for children, pregnant women, hospitalized patients,    patients on dialysis, or with rapidly changing kidney function. According to the NKDEP, eGFR >89 is normal, 60-89 shows mild impairment, 30-59 shows moderate impairment, 15-29 shows severe impairment and <15 is ESRD.      GFR calc non Af Amer  Date Value Ref Range Status  03/23/2020 93 >59 mL/min/1.73 Final   eGFR  Date Value Ref Range Status  02/10/2023 98 >59 mL/min/1.73 Final

## 2023-10-06 NOTE — Telephone Encounter (Signed)
 Requested Prescriptions  Pending Prescriptions Disp Refills   amLODipine-benazepril (LOTREL) 5-20 MG capsule 30 capsule 0    Sig: Take 1 capsule by mouth daily.     Cardiovascular: CCB + ACEI Combos Failed - 10/06/2023  5:40 PM      Failed - Cr in normal range and within 180 days    Creat  Date Value Ref Range Status  10/08/2015 0.98 0.70 - 1.33 mg/dL Final   Creatinine, Ser  Date Value Ref Range Status  02/10/2023 0.83 0.76 - 1.27 mg/dL Final   Creatinine, Urine  Date Value Ref Range Status  09/08/2014 180.7 mg/dL Final    Comment:    No reference range established.         Failed - K in normal range and within 180 days    Potassium  Date Value Ref Range Status  02/10/2023 4.1 3.5 - 5.2 mmol/L Final         Failed - Na in normal range and within 180 days    Sodium  Date Value Ref Range Status  02/10/2023 141 134 - 144 mmol/L Final         Failed - eGFR is 30 or above and within 180 days    GFR, Est African American  Date Value Ref Range Status  10/08/2015 >89 >=60 mL/min Final   GFR calc Af Amer  Date Value Ref Range Status  03/23/2020 108 >59 mL/min/1.73 Final    Comment:    **[facility] currently reports eGFR in compliance with the current**   recommendations of the [employer]. [facility] will   update reporting as new guidelines are published from the NKF-ASN   Task force.    GFR, Est Non African American  Date Value Ref Range Status  10/08/2015 86 >=60 mL/min Final    Comment:      The estimated GFR is a calculation valid for adults (>=56 years old) that uses the CKD-EPI algorithm to adjust for age and sex. It is   not to be used for children, pregnant women, hospitalized patients,    patients on dialysis, or with rapidly changing kidney function. According to the NKDEP, eGFR >89 is normal, 60-89 shows mild impairment, 30-59 shows moderate impairment, 15-29 shows severe impairment and <15 is ESRD.      GFR calc non Af Amer  Date Value  Ref Range Status  03/23/2020 93 >59 mL/min/1.73 Final   eGFR  Date Value Ref Range Status  02/10/2023 98 >59 mL/min/1.73 Final         Failed - Valid encounter within last 6 months    Recent Outpatient Visits           7 months ago Positive self-administered antigen test for COVID-19   [facility], Lauris Poag, MD   7 months ago Type 2 diabetes mellitus without complication, without long-term current use of insulin (HCC)   [place] [facility] at [facility], Lauris Poag, MD   1 year ago Type 2 diabetes mellitus without complication, without long-term current use of insulin (HCC)   [place] [facility] at [facility], MD   2 years ago Insomnia, unspecified type   [place] [facility] at [facility], Lauris Poag, MD   2 years ago Type 2 diabetes mellitus without complication, without long-term current use of insulin [facility])   [place] [facility] at [facility], MD  Passed - Patient is not pregnant      Passed - Last BP in normal range    BP Readings from Last 1 Encounters:  02/10/23 136/77          metFORMIN (GLUCOPHAGE) 1000 MG tablet 60 tablet 0    Sig: Take 1 tablet (1,000 mg total) by mouth 2 (two) times daily with a meal.     Endocrinology:  Diabetes - Biguanides Failed - 10/06/2023  5:40 PM      Failed - HBA1C is between 0 and 7.9 and within 180 days    Hemoglobin A1C  Date Value Ref Range Status  02/10/2023 6.9 (A) 4.0 - 5.6 % Final   HbA1c, POC (controlled diabetic range)  Date Value Ref Range Status  11/21/2020 8.0 (A) 0.0 - 7.0 % Final         Failed - B12 Level in normal range and within 720 days    No results found for: "VITAMINB12"       Failed - Valid encounter within last 6 months    Recent Outpatient Visits           7 months ago Positive self-administered antigen test for COVID-19   [place] [facility] at [facility], MD   7 months ago Type 2 diabetes mellitus without complication, without long-term current use of insulin (HCC)   [place] [facility] at [facility], MD   1 year ago Type 2 diabetes mellitus without complication, without long-term current use of insulin (HCC)   [place] [facility] at [facility], MD   2 years ago Insomnia, unspecified type   [place] [facility] at [facility], Lauris Poag, MD   2 years ago Type 2 diabetes mellitus without complication, without long-term current use of insulin (HCC)   [place] [facility] at [facility], Lauris Poag, MD              Failed - CBC within normal limits and completed in the last 12 months    WBC  Date Value Ref Range Status  03/23/2020 7.3 3.4 - 10.8 x10E3/uL Final  05/03/2018 9.9 4.0 - 10.5 K/uL Final   RBC  Date Value Ref Range Status  03/23/2020 4.96 4.14 - 5.80 x10E6/uL Final  05/03/2018 4.91 4.22 - 5.81 MIL/uL Final   Hemoglobin  Date Value Ref Range Status  03/23/2020 14.8 13.0 - 17.7 g/dL Final   Hematocrit  Date Value Ref Range Status  03/23/2020 42.6 37.5 - 51.0 % Final   MCHC  Date Value Ref Range Status  03/23/2020 34.7 31.5 - 35.7 g/dL Final  40/34/7425 95.6 30.0 - 36.0 g/dL Final   [facility]  Date Value Ref Range Status  03/23/2020 29.8 26.6 - 33.0 pg Final  05/03/2018 29.9 26.0 - 34.0 pg Final   MCV  Date Value Ref Range Status  03/23/2020 86 79 - 97 fL Final   No results found for: "PLTCOUNTKUC", "LABPLAT", "POCPLA" RDW  Date Value Ref Range Status  03/23/2020 12.3 11.6 - 15.4 % Final         Passed - Cr in normal range and within 360 days    Creat  Date Value Ref Range Status  10/08/2015 0.98 0.70 - 1.33 mg/dL Final   Creatinine, Ser  Date Value Ref Range Status  02/10/2023 0.83 0.76 - 1.27 mg/dL Final   Creatinine, Urine  Date Value Ref Range Status  09/08/2014 180.7 mg/dL Final  Comment:    No reference range  established.         Passed - eGFR in normal range and within 360 days    GFR, Est African American  Date Value Ref Range Status  10/08/2015 >89 >=60 mL/min Final   GFR calc Af Amer  Date Value Ref Range Status  03/23/2020 108 >59 mL/min/1.73 Final    Comment:    **[facility] currently reports eGFR in compliance with the current**   recommendations of the [employer]. [facility] will   update reporting as new guidelines are published from the NKF-ASN   Task force.    GFR, Est Non African American  Date Value Ref Range Status  10/08/2015 86 >=60 mL/min Final    Comment:      The estimated GFR is a calculation valid for adults (>=28 years old) that uses the CKD-EPI algorithm to adjust for age and sex. It is   not to be used for children, pregnant women, hospitalized patients,    patients on dialysis, or with rapidly changing kidney function. According to the NKDEP, eGFR >89 is normal, 60-89 shows mild impairment, 30-59 shows moderate impairment, 15-29 shows severe impairment and <15 is ESRD.      GFR calc non Af Amer  Date Value Ref Range Status  03/23/2020 93 >59 mL/min/1.73 Final   eGFR  Date Value Ref Range Status  02/10/2023 98 >59 mL/min/1.73 Final          QUEtiapine (SEROQUEL) 50 MG tablet 30 tablet 1    Sig: Take 1 tablet (50 mg total) by mouth at bedtime.     Not Delegated - Psychiatry:  Antipsychotics - Second Generation (Atypical) - quetiapine Failed - 10/06/2023  5:40 PM      Failed - This refill cannot be delegated      Failed - TSH in normal range and within 360 days    TSH  Date Value Ref Range Status  03/11/2016 1.752 0.350 - 4.500 uIU/mL Final         Failed - Valid encounter within last 6 months    Recent Outpatient Visits           7 months ago Positive self-administered antigen test for COVID-19   [facility], Lauris Poag, MD   7 months ago Type 2 diabetes mellitus without complication, without  long-term current use of insulin (HCC)   [place] [facility] at [facility], Lauris Poag, MD   1 year ago Type 2 diabetes mellitus without complication, without long-term current use of insulin (HCC)   [place] [facility] at [facility], MD   2 years ago Insomnia, unspecified type   [place] [facility] at [facility], Lauris Poag, MD   2 years ago Type 2 diabetes mellitus without complication, without long-term current use of insulin [facility]   [place] [facility] at [facility], Lauris Poag, MD              Failed - Lipid Panel in normal range within the last 12 months    Cholesterol, Total  Date Value Ref Range Status  02/10/2023 195 100 - 199 mg/dL Final   LDL Chol Calc (NIH)  Date Value Ref Range Status  02/10/2023 84 0 - 99 mg/dL Final   HDL  Date Value Ref Range Status  02/10/2023 82 >39 mg/dL Final   Triglycerides  Date Value Ref Range Status  02/10/2023 178 (H) 0 -  149 mg/dL Final         Failed - CBC within normal limits and completed in the last 12 months    WBC  Date Value Ref Range Status  03/23/2020 7.3 3.4 - 10.8 x10E3/uL Final  05/03/2018 9.9 4.0 - 10.5 K/uL Final   RBC  Date Value Ref Range Status  03/23/2020 4.96 4.14 - 5.80 x10E6/uL Final  05/03/2018 4.91 4.22 - 5.81 MIL/uL Final   Hemoglobin  Date Value Ref Range Status  03/23/2020 14.8 13.0 - 17.7 g/dL Final   Hematocrit  Date Value Ref Range Status  03/23/2020 42.6 37.5 - 51.0 % Final   MCHC  Date Value Ref Range Status  03/23/2020 34.7 31.5 - 35.7 g/dL Final  29/56/2130 86.5 30.0 - 36.0 g/dL Final   [facility]  Date Value Ref Range Status  03/23/2020 29.8 26.6 - 33.0 pg Final  05/03/2018 29.9 26.0 - 34.0 pg Final   MCV  Date Value Ref Range Status  03/23/2020 86 79 - 97 fL Final   No results found for: "PLTCOUNTKUC", "LABPLAT", "POCPLA" RDW  Date Value Ref Range Status  03/23/2020 12.3 11.6 - 15.4 % Final         Failed - CMP  within normal limits and completed in the last 12 months    Albumin  Date Value Ref Range Status  02/10/2023 4.4 3.9 - 4.9 g/dL Final   Alkaline Phosphatase  Date Value Ref Range Status  02/10/2023 91 44 - 121 IU/L Final   ALT  Date Value Ref Range Status  02/10/2023 22 0 - 44 IU/L Final   AST  Date Value Ref Range Status  02/10/2023 18 0 - 40 IU/L Final   BUN  Date Value Ref Range Status  02/10/2023 10 8 - 27 mg/dL Final   Calcium  Date Value Ref Range Status  02/10/2023 9.2 8.6 - 10.2 mg/dL Final   CO2  Date Value Ref Range Status  02/10/2023 25 20 - 29 mmol/L Final   Creat  Date Value Ref Range Status  10/08/2015 0.98 0.70 - 1.33 mg/dL Final   Creatinine, Ser  Date Value Ref Range Status  02/10/2023 0.83 0.76 - 1.27 mg/dL Final   Creatinine, Urine  Date Value Ref Range Status  09/08/2014 180.7 mg/dL Final    Comment:    No reference range established.   Glucose  Date Value Ref Range Status  02/10/2023 77 70 - 99 mg/dL Final   Glucose, Bld  Date Value Ref Range Status  05/03/2018 115 (H) 70 - 99 mg/dL Final   POC Glucose  Date Value Ref Range Status  11/21/2020 145 (A) 70 - 99 mg/dl Final   Glucose-Capillary  Date Value Ref Range Status  03/13/2016 158 (H) 65 - 99 mg/dL Final   Potassium  Date Value Ref Range Status  02/10/2023 4.1 3.5 - 5.2 mmol/L Final   Sodium  Date Value Ref Range Status  02/10/2023 141 134 - 144 mmol/L Final   Bilirubin Total  Date Value Ref Range Status  02/10/2023 0.3 0.0 - 1.2 mg/dL Final   Total Protein  Date Value Ref Range Status  02/10/2023 7.1 6.0 - 8.5 g/dL Final   GFR, Est African American  Date Value Ref Range Status  10/08/2015 >89 >=60 mL/min Final   GFR calc Af Amer  Date Value Ref Range Status  03/23/2020 108 >59 mL/min/1.73 Final    Comment:    **[facility] currently reports eGFR in compliance with the current**  recommendations of the [employer]. [facility] will   update reporting  as new guidelines are published from the NKF-ASN   Task force.    eGFR  Date Value Ref Range Status  02/10/2023 98 >59 mL/min/1.73 Final   GFR, Est Non African American  Date Value Ref Range Status  10/08/2015 86 >=60 mL/min Final    Comment:      The estimated GFR is a calculation valid for adults (>=13 years old) that uses the CKD-EPI algorithm to adjust for age and sex. It is   not to be used for children, pregnant women, hospitalized patients,    patients on dialysis, or with rapidly changing kidney function. According to the NKDEP, eGFR >89 is normal, 60-89 shows mild impairment, 30-59 shows moderate impairment, 15-29 shows severe impairment and <15 is ESRD.      GFR calc non Af Amer  Date Value Ref Range Status  03/23/2020 93 >59 mL/min/1.73 Final         Passed - Last BP in normal range    BP Readings from Last 1 Encounters:  02/10/23 136/77         Passed - Last Heart Rate in normal range    Pulse Readings from Last 1 Encounters:  02/10/23 70

## 2023-10-06 NOTE — Telephone Encounter (Signed)
 Received call from Karl Ito at [facility] pre surgery/admit testing dept and she has contacted pt about coming in for the EKG and labs prior to his surgery and pt states he may have to reschedule his surgery from 3/26 because he does not see pcp until 4.4. Pt is not wanting to go to [facility] to get EKG and labs. She is asking if pt could go to pcp office and they can do the needed labs and EKG prior to surgery and his appt with that [facility] pre admit could get the results so pt can proceed with the surgery.

## 2023-10-06 NOTE — Pre-Procedure Instructions (Addendum)
 Spoke to pt this am in attempt to do his PAT interview for surgery scheduled for 10/14/23. He stated the surgery may need to be postponed. Pt has appt with PCP on 10/23/23 for 6 month f/u. He states he will be contacting his PCP to see if there is an earlier appt available in order to keep scheduled surgery date 10/14/23. Will reach back out to him later this am.  PAT phone call moved to 10/07/23 at pt's request. He also does not want to drive all the way to [facility] for labs/EKG if he can get them done by his PCP closer to his home, however appt is scheduled for 10/23/23 which is after his surgery date of 10/14/23.  Spoke to [employer] from [place] office [facility], she stated she would attempt to get pt a lab/EKG appt at his PCP's office.

## 2023-10-07 ENCOUNTER — Inpatient Hospital Stay
Admission: RE | Admit: 2023-10-07 | Discharge: 2023-10-07 | Disposition: A | Discharge status: Home / Self Care | Source: Ambulatory Visit

## 2023-10-07 NOTE — Patient Instructions (Signed)
 Your procedure is scheduled on: 10/14/23 - Wednesday Report to the Registration Desk on the 1st floor of the [facility]. To find out your arrival time, please call [phone] between 1PM - 3PM on: 10/13/23 - Tuesday If your arrival time is 6:00 am, do not arrive before that time as the [facility] entrance doors do not open until 6:00 am.  REMEMBER: Instructions that are not followed completely may result in serious medical risk, up to and including death; or upon the discretion of your surgeon and anesthesiologist your surgery may need to be rescheduled.  Do not eat food after midnight the night before surgery.  No gum chewing or hard candies.  You may however, drink CLEAR liquids up to 2 hours before you are scheduled to arrive for your surgery. Do not drink anything within 2 hours of your scheduled arrival time.  Clear liquids include: - water   One week prior to surgery: Stop Anti-inflammatories (NSAIDS) such as Advil, Aleve, Ibuprofen, Motrin, Naproxen, Naprosyn and Aspirin based products such as Excedrin, Goody's Powder, BC Powder. You may take Tylenol if needed for pain up until the day of surgery.  Stop ANY OVER THE COUNTER supplements until after surgery : Cinnamon , Garlic, Multiple Vitamin .VITAMIN D3 .   HOLD amLODipine-benazepril (LOTREL) on the day of surgery.  HOLD metFORMIN (GLUCOPHAGE) beginning 10/12/23.  HOLD sildenafil (VIAGRA) 2 days prior to your surgery.  ON THE DAY OF SURGERY ONLY TAKE THESE MEDICATIONS WITH SIPS OF WATER:  none   No Alcohol for 24 hours before or after surgery.  No Smoking including e-cigarettes for 24 hours before surgery.  No chewable tobacco products for at least 6 hours before surgery.  No nicotine patches on the day of surgery.  Do not use any "recreational" drugs for at least a week (preferably 2 weeks) before your surgery.  Please be advised that the combination of cocaine and anesthesia may have negative outcomes, up to  and including death. If you test positive for cocaine, your surgery will be cancelled.  On the morning of surgery brush your teeth with toothpaste and water, you may rinse your mouth with mouthwash if you wish. Do not swallow any toothpaste or mouthwash.  Use CHG Soap or wipes as directed on instruction sheet.  Do not wear jewelry, make-up, hairpins, clips or nail polish.  For welded (permanent) jewelry: bracelets, anklets, waist bands, etc.  Please have this removed prior to surgery.  If it is not removed, there is a chance that hospital personnel will need to cut it off on the day of surgery.  Do not wear lotions, powders, or perfumes.   Do not shave body hair from the neck down 48 hours before surgery.  Contact lenses, hearing aids and dentures may not be worn into surgery.  Do not bring valuables to the hospital. [facility] is not responsible for any missing/lost belongings or valuables.   Notify your doctor if there is any change in your medical condition (cold, fever, infection).  Wear comfortable clothing (specific to your surgery type) to the hospital.  After surgery, you can help prevent lung complications by doing breathing exercises.  Take deep breaths and cough every 1-2 hours. Your doctor may order a device called an Incentive Spirometer to help you take deep breaths. When coughing or sneezing, hold a pillow firmly against your incision with both hands. This is called "splinting." Doing this helps protect your incision. It also decreases belly discomfort.  If you are  being admitted to the hospital overnight, leave your suitcase in the car. After surgery it may be brought to your room.  In case of increased patient census, it may be necessary for you, the patient, to continue your postoperative care in the Same Day Surgery department.  If you are being discharged the day of surgery, you will not be allowed to drive home. You will need a responsible individual to drive  you home and stay with you for 24 hours after surgery.   If you are taking public transportation, you will need to have a responsible individual with you.  Please call the Pre-admissions Testing Dept. at [phone] if you have any questions about these instructions.  Surgery Visitation Policy:  Patients having surgery or a procedure may have two visitors.  Children under the age of 67 must have an adult with them who is not the patient.  Temporary Visitor Restrictions Due to increasing cases of flu, RSV and COVID-19: Children ages 24 and under will not be able to visit patients in [facility] hospitals under most circumstances.  Inpatient Visitation:    Visiting hours are 7 a.m. to 8 p.m. Up to four visitors are allowed at one time in a patient room. The visitors may rotate out with other people during the day.  One visitor age 41 or older may stay with the patient overnight and must be in the room by 8 p.m.

## 2023-10-09 ENCOUNTER — Other Ambulatory Visit: Payer: Self-pay | Admitting: Family Medicine

## 2023-10-09 DIAGNOSIS — G4700 Insomnia, unspecified: Secondary | ICD-10-CM

## 2023-10-14 ENCOUNTER — Ambulatory Visit: Admit: 2023-10-14 | Payer: Medicaid Other | Admitting: Podiatry

## 2023-10-14 DIAGNOSIS — I10 Essential (primary) hypertension: Secondary | ICD-10-CM

## 2023-10-14 DIAGNOSIS — E119 Type 2 diabetes mellitus without complications: Secondary | ICD-10-CM

## 2023-10-14 DIAGNOSIS — F17200 Nicotine dependence, unspecified, uncomplicated: Secondary | ICD-10-CM

## 2023-10-14 SURGERY — BUNIONECTOMY
Anesthesia: Choice | Site: Toe | Laterality: Right

## 2023-10-20 ENCOUNTER — Encounter: Payer: Medicaid Other | Admitting: Podiatry

## 2023-10-23 ENCOUNTER — Encounter: Payer: Self-pay | Admitting: Family Medicine

## 2023-10-23 ENCOUNTER — Ambulatory Visit: Admitting: Family Medicine

## 2023-10-23 VITALS — BP 157/88 | HR 71 | Temp 98.1°F | Resp 16 | Ht 75.0 in | Wt 195.0 lb

## 2023-10-23 DIAGNOSIS — E119 Type 2 diabetes mellitus without complications: Secondary | ICD-10-CM | POA: Diagnosis not present

## 2023-10-23 DIAGNOSIS — G4700 Insomnia, unspecified: Secondary | ICD-10-CM

## 2023-10-23 DIAGNOSIS — I10 Essential (primary) hypertension: Secondary | ICD-10-CM | POA: Diagnosis not present

## 2023-10-23 DIAGNOSIS — E782 Mixed hyperlipidemia: Secondary | ICD-10-CM | POA: Diagnosis not present

## 2023-10-23 DIAGNOSIS — Z1211 Encounter for screening for malignant neoplasm of colon: Secondary | ICD-10-CM

## 2023-10-23 LAB — POCT GLYCOSYLATED HEMOGLOBIN (HGB A1C): Hemoglobin A1C: 6.9 % — AB (ref 4.0–5.6)

## 2023-10-23 MED ORDER — QUETIAPINE FUMARATE 50 MG PO TABS
50.0000 mg | ORAL_TABLET | Freq: Every day | ORAL | 0 refills | Status: DC
Start: 2023-10-23 — End: 2024-01-13

## 2023-10-23 MED ORDER — AMLODIPINE BESY-BENAZEPRIL HCL 10-20 MG PO CAPS: 1.0000 | ORAL_CAPSULE | Freq: Every day | ORAL | 1 refills | Status: DC

## 2023-10-28 ENCOUNTER — Encounter: Payer: Self-pay | Admitting: Family Medicine

## 2023-10-28 NOTE — Progress Notes (Signed)
 Established Patient Office Visit  Subjective    Patient ID: Jose Deleon, male    DOB: Jul 19, 1961  Age: 63 y.o. MRN: [id number]  CC:  Chief Complaint  Patient presents with   Follow-up    6 month    HPI Jose Deleon presents for follow up of chronic med issues including diabetes, hypertension,  and insomnia. Patient reports med compliance and denies acute complaints.   Outpatient Encounter Medications as of 10/23/2023  Medication Sig   amLODipine-benazepril (LOTREL) 10-20 MG capsule Take 1 capsule by mouth daily.   amLODipine-benazepril (LOTREL) 5-20 MG capsule Take 1 capsule by mouth daily.   aspirin 81 MG tablet Take 1 tablet (81 mg total) by mouth daily.   Blood Glucose Monitoring Suppl (TRUERESULT BLOOD GLUCOSE) w/Device KIT 1 each by Does not apply route daily.   cholecalciferol (VITAMIN D3) 25 MCG (1000 UNIT) tablet Take 1,000 Units by mouth daily.   Cinnamon 500 MG TABS Take 500 mg by mouth daily.   Garlic 100 MG TABS Take 100 mg by mouth every morning.   glucose blood (TRUETEST TEST) test strip 1 each by Other route 2 (two) times daily. Use as instructed   metFORMIN (GLUCOPHAGE) 1000 MG tablet Take 1 tablet (1,000 mg total) by mouth 2 (two) times daily with a meal.   Multiple Vitamin (MULTIVITAMIN WITH MINERALS) TABS tablet Take 1 tablet by mouth daily.   sildenafil (VIAGRA) 100 MG tablet Take 1 tablet (100 mg total) by mouth as needed for erectile dysfunction.   [DISCONTINUED] QUEtiapine (SEROQUEL) 50 MG tablet TAKE 1 TABLET BY MOUTH AT BEDTIME   ketoconazole (NIZORAL) 2 % cream Apply 1 Application topically daily. (Patient taking differently: Apply 1 Application topically every other day. Applies to toes)   QUEtiapine (SEROQUEL) 50 MG tablet Take 1 tablet (50 mg total) by mouth at bedtime.   No facility-administered encounter medications on file as of 10/23/2023.    Past Medical History:  Diagnosis Date   Callus of toe 2024   Capsulitis of foot, right 2024   FH:  heart disease    GERD (gastroesophageal reflux disease)    Hav (hallux abducto valgus), right 2024   High cholesterol    History of gout    "reaction from the TB RX"   Hypertension 2015   TB (pulmonary tuberculosis) 1990   Type II diabetes mellitus (HCC) dx ~ 2015    Past Surgical History:  Procedure Laterality Date   CARDIAC CATHETERIZATION N/A 03/13/2016   Procedure: Left Heart Cath and Coronary Angiography;  Surgeon: Peter M Swaziland, MD;  [facility] INVASIVE CV LAB;  Service: Cardiovascular;  Laterality: N/A;   HEMORRHOID SURGERY  1981   "lanced"    Family History  Problem Relation Age of Onset   Hypertension Mother    Heart attack Mother 2   Hypertension Father 1   Multiple sclerosis Father    Stroke Maternal Grandmother     Social History   Socioeconomic History   Marital status: Single    Spouse name: Not on file   Number of children: Not on file   Years of education: Not on file   Highest education level: Associate degree: occupational, Scientist, product/process development, or vocational program  Occupational History   Occupation: Retired Editor, 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  Tobacco Use   Smoking status: Light Smoker    Current packs/day: 0.00    Average packs/day: 0.3 packs/day for 30.0 years (9.9 ttl pk-yrs)    Types: Cigarettes    Start date: 03/07/1986  Last attempt to quit: 03/07/2016    Years since quitting: 7.6   Smokeless tobacco: Never   Tobacco comments:    1-2 cigs a day  Vaping Use   Vaping status: Never Used  Substance and Sexual Activity   Alcohol use: Yes    Alcohol/week: 4.0 standard drinks of alcohol    Types: 4 Cans of beer per week   Drug use: No   Sexual activity: Not Currently  Other Topics Concern   Not on file  Social History Narrative   Not on file   Social Drivers of Health   Financial Resource Strain: Low Risk  (10/22/2023)   Overall Financial Resource Strain (CARDIA)    Difficulty of Paying Living Expenses: Not very hard  Food Insecurity: No Food Insecurity  (10/22/2023)   Hunger Vital Sign    Worried About Running Out of Food in the Last Year: Never true    Ran Out of Food in the Last Year: Never true  Transportation Needs: No Transportation Needs (10/23/2023)   PRAPARE - Administrator, Civil Service (Medical): No    Lack of Transportation (Non-Medical): No  Physical Activity: Inactive (10/23/2023)   Exercise Vital Sign    Days of Exercise per Week: 0 days    Minutes of Exercise per Session: 0 min  Stress: Stress Concern Present (10/22/2023)   [employer] of Occupational Health - Occupational Stress Questionnaire    Feeling of Stress : To some extent  Social Connections: Unknown (10/23/2023)   Social Connection and Isolation Panel [NHANES]    Frequency of Communication with Friends and Family: More than three times a week    Frequency of Social Gatherings with Friends and Family: Once a week    Attends Religious Services: Patient declined    Database administrator or Organizations: No    Attends Banker Meetings: Never    Marital Status: Never married  Intimate Partner Violence: Not At Risk (10/23/2023)   Humiliation, Afraid, Rape, and Kick questionnaire    Fear of Current or Ex-Partner: No    Emotionally Abused: No    Physically Abused: No    Sexually Abused: No    Review of Systems  All other systems reviewed and are negative.       Objective    BP (!) 157/88   Pulse 71   Temp 98.1 F (36.7 C) (Oral)   Resp 16   Ht 6\' 3"  (1.905 m)   Wt 195 lb (88.5 kg)   SpO2 93%   BMI 24.37 kg/m   Physical Exam Vitals and nursing note reviewed.  Constitutional:      General: He is not in acute distress. HENT:     Mouth/Throat:     Dentition: Abnormal dentition. Dental tenderness present.     Comments:   Cardiovascular:     Rate and Rhythm: Normal rate and regular rhythm.  Pulmonary:     Effort: Pulmonary effort is normal.     Breath sounds: Normal breath sounds.  Abdominal:     Palpations: Abdomen  is soft.     Tenderness: There is no abdominal tenderness.  Musculoskeletal:     Right lower leg: No edema.     Left lower leg: No edema.  Neurological:     General: No focal deficit present.     Mental Status: He is alert and oriented to person, place, and time.         Assessment & Plan:  1. Type 2 diabetes mellitus without complication, without long-term current use of insulin (HCC) (Primary) A1c stable and at goal. Continue  - HgB A1c  2. Insomnia, unspecified type Meds refilled.  - QUEtiapine (SEROQUEL) 50 MG tablet; Take 1 tablet (50 mg total) by mouth at bedtime.  Dispense: 30 tablet; Refill: 0  3. Mixed hyperlipidemia   4. Essential hypertension Elevated readings. Will increase lotrel from 5/20 to 10/20  5. Screening for colon cancer  - Cologuard    Return in about 3 months (around 01/22/2024) for follow up, chronic med issues.   Tommie Raymond, MD

## 2023-10-30 ENCOUNTER — Other Ambulatory Visit: Payer: Self-pay

## 2023-10-30 ENCOUNTER — Telehealth: Payer: Self-pay

## 2023-10-30 NOTE — Telephone Encounter (Signed)
 Thank you !   Called patient verified DOB and he is aware of note

## 2023-10-30 NOTE — Telephone Encounter (Signed)
 Copied from CRM [id number]. Topic: Clinical - Prescription Issue >> Oct 30, 2023 10:58 AM Jose Deleon wrote: Reason for CRM: [facility] pharmacy calling. Patients QUEtiapine (SEROQUEL) 50 MG tablet, needs a PA, required to fill. Stated they sent one over and it was cancelled by the office. [phone] Callback. Patient does want this filled through his insurance.

## 2023-11-04 ENCOUNTER — Other Ambulatory Visit: Payer: Self-pay | Admitting: Family Medicine

## 2023-11-04 DIAGNOSIS — E119 Type 2 diabetes mellitus without complications: Secondary | ICD-10-CM

## 2023-11-09 ENCOUNTER — Telehealth: Payer: Self-pay | Admitting: Family Medicine

## 2023-11-09 NOTE — Telephone Encounter (Signed)
 Patient has dental abscess and has appointment at dental clinic in [place] , they asked him to get refill of antibiotics to help with  pain until he is seen patient wants to know if this can be refilled.the Amoxicillin  875 mg

## 2023-11-11 ENCOUNTER — Other Ambulatory Visit: Payer: Self-pay | Admitting: Family Medicine

## 2023-11-11 MED ORDER — AMOXICILLIN 875 MG PO TABS: 875.0000 mg | ORAL_TABLET | Freq: Two times a day (BID) | ORAL | 0 refills | Status: AC

## 2023-11-13 ENCOUNTER — Other Ambulatory Visit: Payer: Self-pay | Admitting: Family Medicine

## 2023-11-13 DIAGNOSIS — N522 Drug-induced erectile dysfunction: Secondary | ICD-10-CM

## 2023-11-26 ENCOUNTER — Ambulatory Visit: Admitting: Family Medicine

## 2023-12-06 ENCOUNTER — Other Ambulatory Visit: Payer: Self-pay | Admitting: Family Medicine

## 2023-12-06 DIAGNOSIS — N522 Drug-induced erectile dysfunction: Secondary | ICD-10-CM

## 2023-12-10 ENCOUNTER — Other Ambulatory Visit: Payer: Self-pay | Admitting: Family Medicine

## 2023-12-10 DIAGNOSIS — N522 Drug-induced erectile dysfunction: Secondary | ICD-10-CM

## 2024-01-04 ENCOUNTER — Other Ambulatory Visit: Payer: Self-pay | Admitting: Family Medicine

## 2024-01-04 DIAGNOSIS — G4700 Insomnia, unspecified: Secondary | ICD-10-CM

## 2024-01-13 ENCOUNTER — Telehealth: Payer: Self-pay | Admitting: Family Medicine

## 2024-01-13 NOTE — Telephone Encounter (Signed)
 Patient needs refill for SEROQUEL  to pharmacy

## 2024-01-14 ENCOUNTER — Ambulatory Visit
Admission: EM | Admit: 2024-01-14 | Discharge: 2024-01-14 | Disposition: A | Discharge status: Home / Self Care | Attending: Physician Assistant | Admitting: Physician Assistant

## 2024-01-14 ENCOUNTER — Encounter: Payer: Self-pay | Admitting: Emergency Medicine

## 2024-01-14 DIAGNOSIS — K0889 Other specified disorders of teeth and supporting structures: Secondary | ICD-10-CM

## 2024-01-14 MED ORDER — AMOXICILLIN 500 MG PO CAPS: 500.0000 mg | ORAL_CAPSULE | Freq: Three times a day (TID) | ORAL | 0 refills | Status: DC

## 2024-01-14 NOTE — ED Provider Notes (Signed)
 [facility]    CSN: [id number] Arrival date & time: 01/14/24  1237      History   Chief Complaint Chief Complaint  Patient presents with   Dental Pain    HPI Jose Deleon is a 63 y.o. male.   Patient presents today for evaluation of dental pain to the right upper molar area that he has had the last week.  He has tried Tylenol  and ibuprofen  without resolution.  He notes he is currently on a wait list to have teeth extracted.  He reports he has been having some pain that radiates up into the lateral side of his head on the right.  He does not report any fever.  The history is provided by the patient.  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 Authorizing Provider  amLODipine -benazepril  (LOTREL) 10-20 MG capsule Take 1 capsule by mouth daily.  10/23/23  Yes Tanda Bleacher, MD  amoxicillin  (AMOXIL ) 500 MG capsule Take 1 capsule (500 mg total) by mouth 3 (three) times daily. 01/14/24  Yes Billy Asberry FALCON, PA-C  aspirin  81 MG tablet Take 1 tablet (81 mg total) by mouth daily. 03/16/18  Yes Fulp, Cammie, MD  Blood Glucose Monitoring Suppl (TRUERESULT BLOOD GLUCOSE) w/Device KIT 1 each by Does not apply route daily. 03/16/18  Yes Fulp, Cammie, MD  Garlic 100 MG TABS Take 100 mg by mouth every morning.   Yes [provider]  glucose blood (TRUETEST TEST) test strip 1 each by Other route 2 (two) times daily. Use as instructed 03/16/18  Yes Fulp, Cammie, MD  ketoconazole  (NIZORAL ) 2 % cream Apply 1 Application topically daily. Patient taking differently: Apply 1 Application topically every other day. Applies to toes 06/07/23  Yes Standiford, Marsa FALCON, DPM  metFORMIN  (GLUCOPHAGE ) 1000 MG tablet TAKE 1 TABLET BY MOUTH TWICE DAILY WITH  A  MEAL 11/04/23  Yes Tanda Bleacher, MD  Multiple Vitamin (MULTIVITAMIN WITH MINERALS) TABS tablet Take 1 tablet by mouth daily.   Yes [provider]  QUEtiapine  (SEROQUEL ) 50 MG tablet TAKE 1 TABLET BY MOUTH AT BEDTIME 01/13/24  Yes Tanda Bleacher, MD  amLODipine -benazepril  (LOTREL) 5-20 MG capsule Take 1 capsule by mouth daily. Patient not taking:  Reported on 01/14/2024 10/06/23   Tanda Bleacher, MD  cholecalciferol (VITAMIN D3) 25 MCG (1000 UNIT) tablet Take 1,000 Units by mouth daily. Patient not taking: Reported on 01/14/2024    [provider]  Cinnamon 500 MG TABS Take 500 mg by mouth daily. Patient not taking: Reported on 01/14/2024    [provider]  sildenafil  (VIAGRA ) 100 MG tablet TAKE 1 TABLET BY MOUTH AS NEEDED FOR ERECTILE DYSFUNCTION 12/11/23   Tanda Bleacher, MD    Family History Family History  Problem Relation Age of Onset   Hypertension Mother    Heart attack Mother 71   Hypertension Father 6   Multiple sclerosis Father    Stroke Maternal Grandmother      Social History Social History   Tobacco Use   Smoking status: Light Smoker    Current packs/day: 0.00    Average packs/day: 0.3 packs/day for 30.0 years (9.9 ttl pk-yrs)    Types: Cigarettes    Start date: 03/07/1986    Last attempt to quit: 03/07/2016    Years since quitting: 7.8   Smokeless tobacco: Never   Tobacco comments:    1-2 cigs a day  Vaping Use   Vaping status: Never Used  Substance Use Topics   Alcohol use: Yes    Alcohol/week: 4.0 standard drinks of alcohol    Types: 4 Cans of beer per week   Drug use: No     Allergies   Patient has no known allergies.   Review of Systems Review of Systems  Constitutional:  Negative for chills and fever.  HENT:  Positive for dental problem.   Eyes:  Negative for discharge and redness.  Neurological:  Positive for headaches. Negative for numbness.     Physical Exam Triage Vital Signs ED Triage Vitals [01/14/24 1306]  Encounter Vitals Group     BP (!) 151/84     Girls Systolic BP Percentile      Girls Diastolic BP Percentile      Boys Systolic BP Percentile      Boys Diastolic BP Percentile      Pulse Rate 77     Resp 14     Temp 98 F (36.7 C)     Temp Source Oral     SpO2 95 %     Weight      Height      Head Circumference      Peak Flow      Pain Score 10     Pain Loc      Pain Education      Exclude from Growth Chart    No data found.  Updated Vital Signs BP (!) 151/84 (BP Location: Left Arm)   Pulse 77   Temp 98 F (36.7 C) (Oral)   Resp 14   SpO2 95%   Visual Acuity Right Eye Distance:   Left Eye Distance:   Bilateral Distance:    Right Eye Near:   Left Eye Near:    Bilateral Near:     Physical Exam Vitals and nursing note reviewed.  Constitutional:      General: He is not in acute distress.    Appearance: Normal appearance. He is not ill-appearing.  HENT:     Head: Normocephalic and atraumatic.     Mouth/Throat:     Comments: Poor dentition throughout with multiple missing  teeth and caries.  Gingival swelling appreciated to right upper molar area with swelling into right maxillary area as well.  Eyes:     Conjunctiva/sclera: Conjunctivae normal.    Cardiovascular:     Rate and Rhythm: Normal rate.  Pulmonary:     Effort: Pulmonary effort is normal. No respiratory distress.   Neurological:     Mental Status: He is alert.   Psychiatric:        Mood and Affect: Mood normal.        Behavior: Behavior normal.        Thought Content: Thought content normal.      UC Treatments / Results  Labs (all labs ordered are listed, but only abnormal results are displayed) Labs Reviewed - No data to display  EKG   Radiology No results found.  Procedures Procedures (including critical care time)  Medications Ordered in UC Medications - No data to display  Initial Impression / Assessment and Plan / UC Course  I have reviewed the triage vital signs and the nursing notes.  Pertinent labs & imaging results that were available during my care of the patient were reviewed by me and considered in my medical decision making (see chart for details).    Will treat to cover possible abscess with amoxicillin  with strict instruction to report to emergency room with any worsening.  Patient expressed understanding.  Contact information provided for other dental resources.  Final Clinical Impressions(s) / UC Diagnoses   Final diagnoses:  Pain, dental   Discharge Instructions   None    ED Prescriptions     Medication Sig Dispense Auth. Provider   amoxicillin  (AMOXIL ) 500 MG capsule Take 1 capsule (500 mg total) by mouth 3 (three) times daily. 21 capsule Billy Asberry FALCON, PA-C      PDMP not reviewed this encounter.   Billy Asberry FALCON, PA-C 01/14/24 1323

## 2024-01-14 NOTE — Telephone Encounter (Signed)
 Called patient and made him aware that refills were sent in yesterday to the [facility]

## 2024-01-14 NOTE — ED Triage Notes (Signed)
 Pt reports dental pain to upper R side x1week. He is currently on a waitlist with [facility] dentistry school. Has been taking significant amount of ibuprofen  (8-9/day)

## 2024-01-15 ENCOUNTER — Other Ambulatory Visit: Payer: Self-pay | Admitting: Family Medicine

## 2024-01-15 DIAGNOSIS — N522 Drug-induced erectile dysfunction: Secondary | ICD-10-CM

## 2024-01-25 ENCOUNTER — Ambulatory Visit: Admitting: Family Medicine

## 2024-01-25 ENCOUNTER — Other Ambulatory Visit: Payer: Self-pay | Admitting: Family Medicine

## 2024-01-25 DIAGNOSIS — E119 Type 2 diabetes mellitus without complications: Secondary | ICD-10-CM

## 2024-01-25 DIAGNOSIS — G4700 Insomnia, unspecified: Secondary | ICD-10-CM

## 2024-01-25 NOTE — Telephone Encounter (Unsigned)
 Copied from CRM [id number]. Topic: Clinical - Medication Refill >> Jan 25, 2024  8:12 AM Jalayah J wrote: Medication: metFORMIN  (GLUCOPHAGE ) 1000 MG tablet, amLODipine -benazepril  (LOTREL) 10-20 MG capsule, amoxicillin  (AMOXIL ) 500 MG capsule, QUEtiapine  (SEROQUEL ) 50 MG tablet  Has the patient contacted their pharmacy? Yes (Agent: If no, request that the patient contact the pharmacy for the refill. If patient does not wish to contact the pharmacy document the reason why and proceed with request.) (Agent: If yes, when and what did the pharmacy advise?)  This is the patient's preferred pharmacy:    [facility] [address] [place], [place] - [address] [place] [address] Phone: [phone] Fax: [phone]  Is this the correct pharmacy for this prescription? Yes If no, delete pharmacy and type the correct one.   Has the prescription been filled recently? Yes  Is the patient out of the medication? No  Has the patient been seen for an appointment in the last year OR does the patient have an upcoming appointment? Yes  Can we respond through MyChart? Yes  Agent: Please be advised that Rx refills may take up to 3 business days. We ask that you follow-up with your pharmacy.

## 2024-01-28 MED ORDER — METFORMIN HCL 1000 MG PO TABS
1000.0000 mg | ORAL_TABLET | Freq: Two times a day (BID) | ORAL | 0 refills | Status: DC
Start: 2024-01-28 — End: 2024-05-12

## 2024-01-28 MED ORDER — AMLODIPINE BESY-BENAZEPRIL HCL 10-20 MG PO CAPS: 1.0000 | ORAL_CAPSULE | Freq: Every day | ORAL | 1 refills | Status: AC

## 2024-01-28 NOTE — Telephone Encounter (Signed)
 Requested medication (s) are due for refill today:   Requested medication (s) are on the active medication list: Yes  Last refill:    Future visit scheduled: Yes  Notes to clinic:  See requests.    Requested Prescriptions  Pending Prescriptions Disp Refills   amoxicillin  (AMOXIL ) 500 MG capsule 21 capsule 0    Sig: Take 1 capsule (500 mg total) by mouth 3 (three) times daily.     Off-Protocol Failed - 01/28/2024  7:50 AM      Failed - Medication not assigned to a protocol, review manually.      Passed - Valid encounter within last 12 months    Recent Outpatient Visits           3 months ago Type 2 diabetes mellitus without complication, without long-term current use of insulin  (HCC)   [place] [facility], [place]   10 months ago Positive self-administered antigen test for COVID-19   [facility], [place], [place]   11 months ago Type 2 diabetes mellitus without complication, without long-term current use of insulin  [facility])   [place] [facility], [place], [place]   1 year ago Type 2 diabetes mellitus without complication, without long-term current use of insulin  [facility])   [place] [facility], MD   2 years ago Insomnia, unspecified type   [place] [facility], [place], [place]               QUEtiapine  (SEROQUEL ) 50 MG tablet 30 tablet 0    Sig: Take 1 tablet (50 mg total) by mouth at bedtime.     Not Delegated - Psychiatry:  Antipsychotics - Second Generation (Atypical) - quetiapine  Failed - 01/28/2024  7:50 AM      Failed - This refill cannot be delegated      Failed - TSH in normal range and within 360 days    TSH  Date Value Ref Range Status  03/11/2016 1.752 0.350 - 4.500 uIU/mL Final         Failed - Last BP in normal range    BP Readings from Last 1 Encounters:  01/14/24 (!) 151/84         Failed - Lipid Panel in  normal range within the last 12 months    Cholesterol, Total  Date Value Ref Range Status  02/10/2023 195 100 - 199 mg/dL Final   LDL Chol Calc (NIH)  Date Value Ref Range Status  02/10/2023 84 0 - 99 mg/dL Final   HDL  Date Value Ref Range Status  02/10/2023 82 >39 mg/dL Final   Triglycerides  Date Value Ref Range Status  02/10/2023 178 (H) 0 - 149 mg/dL Final         Failed - CBC within normal limits and completed in the last 12 months    WBC  Date Value Ref Range Status  03/23/2020 7.3 3.4 - 10.8 x10E3/uL Final  05/03/2018 9.9 4.0 - 10.5 K/uL Final   RBC  Date Value Ref Range Status  03/23/2020 4.96 4.14 - 5.80 x10E6/uL Final  05/03/2018 4.91 4.22 - 5.81 MIL/uL Final   Hemoglobin  Date Value Ref Range Status  03/23/2020 14.8 13.0 - 17.7 g/dL Final   Hematocrit  Date Value Ref Range Status  03/23/2020 42.6 37.5 - 51.0 % Final   MCHC  Date Value Ref Range Status  03/23/2020  34.7 31.5 - 35.7 g/dL Final  89/85/7980 67.9 30.0 - 36.0 g/dL Final   [facility]  Date Value Ref Range Status  03/23/2020 29.8 26.6 - 33.0 pg Final  05/03/2018 29.9 26.0 - 34.0 pg Final   MCV  Date Value Ref Range Status  03/23/2020 86 79 - 97 fL Final   No results found for: PLTCOUNTKUC, LABPLAT, POCPLA RDW  Date Value Ref Range Status  03/23/2020 12.3 11.6 - 15.4 % Final         Failed - CMP within normal limits and completed in the last 12 months    Albumin  Date Value Ref Range Status  02/10/2023 4.4 3.9 - 4.9 g/dL Final   Alkaline Phosphatase  Date Value Ref Range Status  02/10/2023 91 44 - 121 IU/L Final   ALT  Date Value Ref Range Status  02/10/2023 22 0 - 44 IU/L Final   AST  Date Value Ref Range Status  02/10/2023 18 0 - 40 IU/L Final   BUN  Date Value Ref Range Status  02/10/2023 10 8 - 27 mg/dL Final   Calcium   Date Value Ref Range Status  02/10/2023 9.2 8.6 - 10.2 mg/dL Final   CO2  Date Value Ref Range Status  02/10/2023 25 20 - 29 mmol/L Final    Creat  Date Value Ref Range Status  10/08/2015 0.98 0.70 - 1.33 mg/dL Final   Creatinine, Ser  Date Value Ref Range Status  02/10/2023 0.83 0.76 - 1.27 mg/dL Final   Creatinine, Urine  Date Value Ref Range Status  09/08/2014 180.7 mg/dL Final    Comment:    No reference range established.   Glucose  Date Value Ref Range Status  02/10/2023 77 70 - 99 mg/dL Final   Glucose, Bld  Date Value Ref Range Status  05/03/2018 115 (H) 70 - 99 mg/dL Final   POC Glucose  Date Value Ref Range Status  11/21/2020 145 (A) 70 - 99 mg/dl Final   Glucose-Capillary  Date Value Ref Range Status  03/13/2016 158 (H) 65 - 99 mg/dL Final   Potassium  Date Value Ref Range Status  02/10/2023 4.1 3.5 - 5.2 mmol/L Final   Sodium  Date Value Ref Range Status  02/10/2023 141 134 - 144 mmol/L Final   Bilirubin Total  Date Value Ref Range Status  02/10/2023 0.3 0.0 - 1.2 mg/dL Final   Total Protein  Date Value Ref Range Status  02/10/2023 7.1 6.0 - 8.5 g/dL Final   GFR, Est African American  Date Value Ref Range Status  10/08/2015 >89 >=60 mL/min Final   GFR calc Af Amer  Date Value Ref Range Status  03/23/2020 108 >59 mL/min/1.73 Final    Comment:    **[facility] currently reports eGFR in compliance with the current**   recommendations of the [employer]. [facility] will   update reporting as new guidelines are published from the NKF-ASN   Task force.    eGFR  Date Value Ref Range Status  02/10/2023 98 >59 mL/min/1.73 Final   GFR, Est Non African American  Date Value Ref Range Status  10/08/2015 86 >=60 mL/min Final    Comment:      The estimated GFR is a calculation valid for adults (>=1 years old) that uses the CKD-EPI algorithm to adjust for age and sex. It is   not to be used for children, pregnant women, hospitalized patients,    patients on dialysis, or with rapidly changing kidney function. According to  the NKDEP, eGFR >89 is normal, 60-89 shows  mild impairment, 30-59 shows moderate impairment, 15-29 shows severe impairment and <15 is ESRD.      GFR calc non Af Amer  Date Value Ref Range Status  03/23/2020 93 >59 mL/min/1.73 Final         Passed - Last Heart Rate in normal range    Pulse Readings from Last 1 Encounters:  01/14/24 77         Passed - Valid encounter within last 6 months    Recent Outpatient Visits           3 months ago Type 2 diabetes mellitus without complication, without long-term current use of insulin  (HCC)   [place] [facility], [place]   10 months ago Positive self-administered antigen test for COVID-19   [place] [facility], [place]   11 months ago Type 2 diabetes mellitus without complication, without long-term current use of insulin  [facility])   [place] [facility], MD   1 year ago Type 2 diabetes mellitus without complication, without long-term current use of insulin  [facility])   [place] [facility], MD   2 years ago Insomnia, unspecified type   [place] [facility], [place]              Signed Prescriptions Disp Refills   metFORMIN  (GLUCOPHAGE ) 1000 MG tablet 180 tablet 0    Sig: Take 1 tablet (1,000 mg total) by mouth 2 (two) times daily with a meal.     Endocrinology:  Diabetes - Biguanides Failed - 01/28/2024  7:50 AM      Failed - B12 Level in normal range and within 720 days    No results found for: VITAMINB12       Failed - CBC within normal limits and completed in the last 12 months    WBC  Date Value Ref Range Status  03/23/2020 7.3 3.4 - 10.8 x10E3/uL Final  05/03/2018 9.9 4.0 - 10.5 K/uL Final   RBC  Date Value Ref Range Status  03/23/2020 4.96 4.14 - 5.80 x10E6/uL Final  05/03/2018 4.91 4.22 - 5.81 MIL/uL Final   Hemoglobin  Date Value Ref Range Status  03/23/2020 14.8 13.0 - 17.7 g/dL Final    Hematocrit  Date Value Ref Range Status  03/23/2020 42.6 37.5 - 51.0 % Final   MCHC  Date Value Ref Range Status  03/23/2020 34.7 31.5 - 35.7 g/dL Final  89/85/7980 67.9 30.0 - 36.0 g/dL Final   [facility]  Date Value Ref Range Status  03/23/2020 29.8 26.6 - 33.0 pg Final  05/03/2018 29.9 26.0 - 34.0 pg Final   MCV  Date Value Ref Range Status  03/23/2020 86 79 - 97 fL Final   No results found for: PLTCOUNTKUC, LABPLAT, POCPLA RDW  Date Value Ref Range Status  03/23/2020 12.3 11.6 - 15.4 % Final         Passed - Cr in normal range and within 360 days    Creat  Date Value Ref Range Status  10/08/2015 0.98 0.70 - 1.33 mg/dL Final   Creatinine, Ser  Date Value Ref Range Status  02/10/2023 0.83 0.76 - 1.27 mg/dL Final   Creatinine, Urine  Date Value Ref Range Status  09/08/2014 180.7 mg/dL Final    Comment:    No reference range established.  Passed - HBA1C is between 0 and 7.9 and within 180 days    Hemoglobin A1C  Date Value Ref Range Status  10/23/2023 6.9 (A) 4.0 - 5.6 % Final   HbA1c, POC (controlled diabetic range)  Date Value Ref Range Status  11/21/2020 8.0 (A) 0.0 - 7.0 % Final         Passed - eGFR in normal range and within 360 days    GFR, Est African American  Date Value Ref Range Status  10/08/2015 >89 >=60 mL/min Final   GFR calc Af Amer  Date Value Ref Range Status  03/23/2020 108 >59 mL/min/1.73 Final    Comment:    **[facility] currently reports eGFR in compliance with the current**   recommendations of the [employer]. [facility] will   update reporting as new guidelines are published from the NKF-ASN   Task force.    GFR, Est Non African American  Date Value Ref Range Status  10/08/2015 86 >=60 mL/min Final    Comment:      The estimated GFR is a calculation valid for adults (>=68 years old) that uses the CKD-EPI algorithm to adjust for age and sex. It is   not to be used for children, pregnant women,  hospitalized patients,    patients on dialysis, or with rapidly changing kidney function. According to the NKDEP, eGFR >89 is normal, 60-89 shows mild impairment, 30-59 shows moderate impairment, 15-29 shows severe impairment and <15 is ESRD.      GFR calc non Af Amer  Date Value Ref Range Status  03/23/2020 93 >59 mL/min/1.73 Final   eGFR  Date Value Ref Range Status  02/10/2023 98 >59 mL/min/1.73 Final         Passed - Valid encounter within last 6 months    Recent Outpatient Visits           3 months ago Type 2 diabetes mellitus without complication, without long-term current use of insulin  (HCC)   [place] [facility], [place]   10 months ago Positive self-administered antigen test for COVID-19   [facility], [place], [place]   11 months ago Type 2 diabetes mellitus without complication, without long-term current use of insulin  [facility])   [place] [facility], [place], [place]   1 year ago Type 2 diabetes mellitus without complication, without long-term current use of insulin  [facility])   [place] [facility], [place]   2 years ago Insomnia, unspecified type   [place] [facility], [place], MD               amLODipine -benazepril  (LOTREL) 10-20 MG capsule 90 capsule 1    Sig: Take 1 capsule by mouth daily.     Cardiovascular: CCB + ACEI Combos Failed - 01/28/2024  7:50 AM      Failed - Cr in normal range and within 180 days    Creat  Date Value Ref Range Status  10/08/2015 0.98 0.70 - 1.33 mg/dL Final   Creatinine, Ser  Date Value Ref Range Status  02/10/2023 0.83 0.76 - 1.27 mg/dL Final   Creatinine, Urine  Date Value Ref Range Status  09/08/2014 180.7 mg/dL Final    Comment:    No reference range established.         Failed - K in normal range and within 180 days    Potassium  Date Value Ref Range Status   02/10/2023 4.1 3.5 - 5.2 mmol/L Final         Failed - Na in normal range and within 180 days    Sodium  Date Value Ref Range Status  02/10/2023 141 134 - 144 mmol/L Final         Failed - eGFR is 30 or above and within 180 days    GFR, Est African American  Date Value Ref Range Status  10/08/2015 >89 >=60 mL/min Final   GFR calc Af Amer  Date Value Ref Range Status  03/23/2020 108 >59 mL/min/1.73 Final    Comment:    **[facility] currently reports eGFR in compliance with the current**   recommendations of the [employer]. [facility] will   update reporting as new guidelines are published from the NKF-ASN   Task force.    GFR, Est Non African American  Date Value Ref Range Status  10/08/2015 86 >=60 mL/min Final    Comment:      The estimated GFR is a calculation valid for adults (>=67 years old) that uses the CKD-EPI algorithm to adjust for age and sex. It is   not to be used for children, pregnant women, hospitalized patients,    patients on dialysis, or with rapidly changing kidney function. According to the NKDEP, eGFR >89 is normal, 60-89 shows mild impairment, 30-59 shows moderate impairment, 15-29 shows severe impairment and <15 is ESRD.      GFR calc non Af Amer  Date Value Ref Range Status  03/23/2020 93 >59 mL/min/1.73 Final   eGFR  Date Value Ref Range Status  02/10/2023 98 >59 mL/min/1.73 Final         Failed - Last BP in normal range    BP Readings from Last 1 Encounters:  01/14/24 (!) 151/84         Passed - Patient is not pregnant      Passed - Valid encounter within last 6 months    Recent Outpatient Visits           3 months ago Type 2 diabetes mellitus without complication, without long-term current use of insulin  (HCC)   [place] [facility], [place]   10 months ago Positive self-administered antigen test for COVID-19   [facility], [place], MD   11 months  ago Type 2 diabetes mellitus without complication, without long-term current use of insulin  [facility])   [place] [facility], [place], [place]   1 year ago Type 2 diabetes mellitus without complication, without long-term current use of insulin  [facility])   [place] [facility], MD   2 years ago Insomnia, unspecified type   [facility], MD

## 2024-01-28 NOTE — Telephone Encounter (Signed)
 Requested Prescriptions  Pending Prescriptions Disp Refills   metFORMIN  (GLUCOPHAGE ) 1000 MG tablet 180 tablet 0    Sig: Take 1 tablet (1,000 mg total) by mouth 2 (two) times daily with a meal.     Endocrinology:  Diabetes - Biguanides Failed - 01/28/2024  7:49 AM      Failed - B12 Level in normal range and within 720 days    No results found for: VITAMINB12       Failed - CBC within normal limits and completed in the last 12 months    WBC  Date Value Ref Range Status  03/23/2020 7.3 3.4 - 10.8 x10E3/uL Final  05/03/2018 9.9 4.0 - 10.5 K/uL Final   RBC  Date Value Ref Range Status  03/23/2020 4.96 4.14 - 5.80 x10E6/uL Final  05/03/2018 4.91 4.22 - 5.81 MIL/uL Final   Hemoglobin  Date Value Ref Range Status  03/23/2020 14.8 13.0 - 17.7 g/dL Final   Hematocrit  Date Value Ref Range Status  03/23/2020 42.6 37.5 - 51.0 % Final   MCHC  Date Value Ref Range Status  03/23/2020 34.7 31.5 - 35.7 g/dL Final  89/85/7980 67.9 30.0 - 36.0 g/dL Final   [facility]  Date Value Ref Range Status  03/23/2020 29.8 26.6 - 33.0 pg Final  05/03/2018 29.9 26.0 - 34.0 pg Final   MCV  Date Value Ref Range Status  03/23/2020 86 79 - 97 fL Final   No results found for: PLTCOUNTKUC, LABPLAT, POCPLA RDW  Date Value Ref Range Status  03/23/2020 12.3 11.6 - 15.4 % Final         Passed - Cr in normal range and within 360 days    Creat  Date Value Ref Range Status  10/08/2015 0.98 0.70 - 1.33 mg/dL Final   Creatinine, Ser  Date Value Ref Range Status  02/10/2023 0.83 0.76 - 1.27 mg/dL Final   Creatinine, Urine  Date Value Ref Range Status  09/08/2014 180.7 mg/dL Final    Comment:    No reference range established.         Passed - HBA1C is between 0 and 7.9 and within 180 days    Hemoglobin A1C  Date Value Ref Range Status  10/23/2023 6.9 (A) 4.0 - 5.6 % Final   HbA1c, POC (controlled diabetic range)  Date Value Ref Range Status  11/21/2020 8.0 (A) 0.0 - 7.0 % Final          Passed - eGFR in normal range and within 360 days    GFR, Est African American  Date Value Ref Range Status  10/08/2015 >89 >=60 mL/min Final   GFR calc Af Amer  Date Value Ref Range Status  03/23/2020 108 >59 mL/min/1.73 Final    Comment:    **[facility] currently reports eGFR in compliance with the current**   recommendations of the [employer]. [facility] will   update reporting as new guidelines are published from the NKF-ASN   Task force.    GFR, Est Non African American  Date Value Ref Range Status  10/08/2015 86 >=60 mL/min Final    Comment:      The estimated GFR is a calculation valid for adults (>=73 years old) that uses the CKD-EPI algorithm to adjust for age and sex. It is   not to be used for children, pregnant women, hospitalized patients,    patients on dialysis, or with rapidly changing kidney function. According to the NKDEP, eGFR >89 is normal, 60-89 shows mild impairment,  30-59 shows moderate impairment, 15-29 shows severe impairment and <15 is ESRD.      GFR calc non Af Amer  Date Value Ref Range Status  03/23/2020 93 >59 mL/min/1.73 Final   eGFR  Date Value Ref Range Status  02/10/2023 98 >59 mL/min/1.73 Final         Passed - Valid encounter within last 6 months    Recent Outpatient Visits           3 months ago Type 2 diabetes mellitus without complication, without long-term current use of insulin  (HCC)   [place] [facility], MD   10 months ago Positive self-administered antigen test for COVID-19   [facility], [place], MD   11 months ago Type 2 diabetes mellitus without complication, without long-term current use of insulin  [facility])   [place] [facility] at [facility], Rag[place], MD   1 year ago Type 2 diabetes mellitus without complication, without long-term current use of insulin  [facility])   [place] [facility], MD    2 years ago Insomnia, unspecified type   [place] [facility], Raguel, MD               amLODipine -benazepril  (LOTREL) 10-20 MG capsule 90 capsule 1    Sig: Take 1 capsule by mouth daily.     Cardiovascular: CCB + ACEI Combos Failed - 01/28/2024  7:49 AM      Failed - Cr in normal range and within 180 days    Creat  Date Value Ref Range Status  10/08/2015 0.98 0.70 - 1.33 mg/dL Final   Creatinine, Ser  Date Value Ref Range Status  02/10/2023 0.83 0.76 - 1.27 mg/dL Final   Creatinine, Urine  Date Value Ref Range Status  09/08/2014 180.7 mg/dL Final    Comment:    No reference range established.         Failed - K in normal range and within 180 days    Potassium  Date Value Ref Range Status  02/10/2023 4.1 3.5 - 5.2 mmol/L Final         Failed - Na in normal range and within 180 days    Sodium  Date Value Ref Range Status  02/10/2023 141 134 - 144 mmol/L Final         Failed - eGFR is 30 or above and within 180 days    GFR, Est African American  Date Value Ref Range Status  10/08/2015 >89 >=60 mL/min Final   GFR calc Af Amer  Date Value Ref Range Status  03/23/2020 108 >59 mL/min/1.73 Final    Comment:    **[facility] currently reports eGFR in compliance with the current**   recommendations of the [employer]. [facility] will   update reporting as new guidelines are published from the NKF-ASN   Task force.    GFR, Est Non African American  Date Value Ref Range Status  10/08/2015 86 >=60 mL/min Final    Comment:      The estimated GFR is a calculation valid for adults (>=26 years old) that uses the CKD-EPI algorithm to adjust for age and sex. It is   not to be used for children, pregnant women, hospitalized patients,    patients on dialysis, or with rapidly changing kidney function. According to the NKDEP, eGFR >89 is normal, 60-89 shows mild impairment, 30-59 shows moderate  impairment, 15-29 shows  severe impairment and <15 is ESRD.      GFR calc non Af Amer  Date Value Ref Range Status  03/23/2020 93 >59 mL/min/1.73 Final   eGFR  Date Value Ref Range Status  02/10/2023 98 >59 mL/min/1.73 Final         Failed - Last BP in normal range    BP Readings from Last 1 Encounters:  01/14/24 (!) 151/84         Passed - Patient is not pregnant      Passed - Valid encounter within last 6 months    Recent Outpatient Visits           3 months ago Type 2 diabetes mellitus without complication, without long-term current use of insulin  (HCC)   [place] [facility], MD   10 months ago Positive self-administered antigen test for COVID-19   [facility], Raguel, MD   11 months ago Type 2 diabetes mellitus without complication, without long-term current use of insulin  [facility])   [place] [facility], Raguel, MD   1 year ago Type 2 diabetes mellitus without complication, without long-term current use of insulin  [facility])   [place] [facility], Raguel, MD   2 years ago Insomnia, unspecified type   [place] [facility], Raguel, MD               amoxicillin  (AMOXIL ) 500 MG capsule 21 capsule 0    Sig: Take 1 capsule (500 mg total) by mouth 3 (three) times daily.     Off-Protocol Failed - 01/28/2024  7:49 AM      Failed - Medication not assigned to a protocol, review manually.      Passed - Valid encounter within last 12 months    Recent Outpatient Visits           3 months ago Type 2 diabetes mellitus without complication, without long-term current use of insulin  [facility])   [place] [facility], [place]   10 months ago Positive self-administered antigen test for COVID-19   [facility], Raguel, MD   11 months ago Type 2 diabetes mellitus without complication,  without long-term current use of insulin  [facility])   [place] [facility] at [facility], MD   1 year ago Type 2 diabetes mellitus without complication, without long-term current use of insulin  [facility])   [place] [facility] at [facility], MD   2 years ago Insomnia, unspecified type   [place] [facility], Raguel, MD               QUEtiapine  (SEROQUEL ) 50 MG tablet 30 tablet 0    Sig: Take 1 tablet (50 mg total) by mouth at bedtime.     Not Delegated - Psychiatry:  Antipsychotics - Second Generation (Atypical) - quetiapine  Failed - 01/28/2024  7:49 AM      Failed - This refill cannot be delegated      Failed - TSH in normal range and within 360 days    TSH  Date Value Ref Range Status  03/11/2016 1.752 0.350 - 4.500 uIU/mL Final         Failed - Last BP in normal range    BP Readings from Last 1  Encounters:  01/14/24 (!) 151/84         Failed - Lipid Panel in normal range within the last 12 months    Cholesterol, Total  Date Value Ref Range Status  02/10/2023 195 100 - 199 mg/dL Final   LDL Chol Calc (NIH)  Date Value Ref Range Status  02/10/2023 84 0 - 99 mg/dL Final   HDL  Date Value Ref Range Status  02/10/2023 82 >39 mg/dL Final   Triglycerides  Date Value Ref Range Status  02/10/2023 178 (H) 0 - 149 mg/dL Final         Failed - CBC within normal limits and completed in the last 12 months    WBC  Date Value Ref Range Status  03/23/2020 7.3 3.4 - 10.8 x10E3/uL Final  05/03/2018 9.9 4.0 - 10.5 K/uL Final   RBC  Date Value Ref Range Status  03/23/2020 4.96 4.14 - 5.80 x10E6/uL Final  05/03/2018 4.91 4.22 - 5.81 MIL/uL Final   Hemoglobin  Date Value Ref Range Status  03/23/2020 14.8 13.0 - 17.7 g/dL Final   Hematocrit  Date Value Ref Range Status  03/23/2020 42.6 37.5 - 51.0 % Final   MCHC  Date Value Ref Range Status  03/23/2020 34.7 31.5 - 35.7 g/dL Final  89/85/7980 67.9 30.0  - 36.0 g/dL Final   [facility]  Date Value Ref Range Status  03/23/2020 29.8 26.6 - 33.0 pg Final  05/03/2018 29.9 26.0 - 34.0 pg Final   MCV  Date Value Ref Range Status  03/23/2020 86 79 - 97 fL Final   No results found for: PLTCOUNTKUC, LABPLAT, POCPLA RDW  Date Value Ref Range Status  03/23/2020 12.3 11.6 - 15.4 % Final         Failed - CMP within normal limits and completed in the last 12 months    Albumin  Date Value Ref Range Status  02/10/2023 4.4 3.9 - 4.9 g/dL Final   Alkaline Phosphatase  Date Value Ref Range Status  02/10/2023 91 44 - 121 IU/L Final   ALT  Date Value Ref Range Status  02/10/2023 22 0 - 44 IU/L Final   AST  Date Value Ref Range Status  02/10/2023 18 0 - 40 IU/L Final   BUN  Date Value Ref Range Status  02/10/2023 10 8 - 27 mg/dL Final   Calcium   Date Value Ref Range Status  02/10/2023 9.2 8.6 - 10.2 mg/dL Final   CO2  Date Value Ref Range Status  02/10/2023 25 20 - 29 mmol/L Final   Creat  Date Value Ref Range Status  10/08/2015 0.98 0.70 - 1.33 mg/dL Final   Creatinine, Ser  Date Value Ref Range Status  02/10/2023 0.83 0.76 - 1.27 mg/dL Final   Creatinine, Urine  Date Value Ref Range Status  09/08/2014 180.7 mg/dL Final    Comment:    No reference range established.   Glucose  Date Value Ref Range Status  02/10/2023 77 70 - 99 mg/dL Final   Glucose, Bld  Date Value Ref Range Status  05/03/2018 115 (H) 70 - 99 mg/dL Final   POC Glucose  Date Value Ref Range Status  11/21/2020 145 (A) 70 - 99 mg/dl Final   Glucose-Capillary  Date Value Ref Range Status  03/13/2016 158 (H) 65 - 99 mg/dL Final   Potassium  Date Value Ref Range Status  02/10/2023 4.1 3.5 - 5.2 mmol/L Final   Sodium  Date Value Ref Range Status  02/10/2023 141  134 - 144 mmol/L Final   Bilirubin Total  Date Value Ref Range Status  02/10/2023 0.3 0.0 - 1.2 mg/dL Final   Total Protein  Date Value Ref Range Status  02/10/2023 7.1 6.0 - 8.5  g/dL Final   GFR, Est African American  Date Value Ref Range Status  10/08/2015 >89 >=60 mL/min Final   GFR calc Af Amer  Date Value Ref Range Status  03/23/2020 108 >59 mL/min/1.73 Final    Comment:    **[facility] currently reports eGFR in compliance with the current**   recommendations of the [employer]. [facility] will   update reporting as new guidelines are published from the NKF-ASN   Task force.    eGFR  Date Value Ref Range Status  02/10/2023 98 >59 mL/min/1.73 Final   GFR, Est Non African American  Date Value Ref Range Status  10/08/2015 86 >=60 mL/min Final    Comment:      The estimated GFR is a calculation valid for adults (>=56 years old) that uses the CKD-EPI algorithm to adjust for age and sex. It is   not to be used for children, pregnant women, hospitalized patients,    patients on dialysis, or with rapidly changing kidney function. According to the NKDEP, eGFR >89 is normal, 60-89 shows mild impairment, 30-59 shows moderate impairment, 15-29 shows severe impairment and <15 is ESRD.      GFR calc non Af Amer  Date Value Ref Range Status  03/23/2020 93 >59 mL/min/1.73 Final         Passed - Last Heart Rate in normal range    Pulse Readings from Last 1 Encounters:  01/14/24 77         Passed - Valid encounter within last 6 months    Recent Outpatient Visits           3 months ago Type 2 diabetes mellitus without complication, without long-term current use of insulin  (HCC)   [place] [facility], MD   10 months ago Positive self-administered antigen test for COVID-19   [facility], Raguel, MD   11 months ago Type 2 diabetes mellitus without complication, without long-term current use of insulin  [facility])   [place] [facility], Raguel, MD   1 year ago Type 2 diabetes mellitus without complication, without long-term current use of insulin   [facility])   [place] [facility], MD   2 years ago Insomnia, unspecified type   [facility], MD

## 2024-02-08 ENCOUNTER — Encounter (HOSPITAL_COMMUNITY): Payer: Self-pay | Admitting: *Deleted

## 2024-02-08 ENCOUNTER — Ambulatory Visit (HOSPITAL_COMMUNITY)
Admission: EM | Admit: 2024-02-08 | Discharge: 2024-02-08 | Disposition: A | Discharge status: Home / Self Care | Attending: Family Medicine | Admitting: Family Medicine

## 2024-02-08 ENCOUNTER — Other Ambulatory Visit: Payer: Self-pay

## 2024-02-08 DIAGNOSIS — M25512 Pain in left shoulder: Secondary | ICD-10-CM | POA: Diagnosis not present

## 2024-02-08 DIAGNOSIS — K047 Periapical abscess without sinus: Secondary | ICD-10-CM

## 2024-02-08 MED ORDER — KETOROLAC TROMETHAMINE 10 MG PO TABS: 10.0000 mg | ORAL_TABLET | Freq: Four times a day (QID) | ORAL | 0 refills | Status: AC | PRN

## 2024-02-08 MED ORDER — KETOROLAC TROMETHAMINE 30 MG/ML IJ SOLN
INTRAMUSCULAR | Status: AC
  Filled 2024-02-08: qty 1

## 2024-02-08 MED ORDER — KETOROLAC TROMETHAMINE 30 MG/ML IJ SOLN
30.0000 mg | Freq: Once | INTRAMUSCULAR | Status: AC
  Administered 2024-02-08: 30 mg via INTRAMUSCULAR

## 2024-02-08 MED ORDER — AMOXICILLIN-POT CLAVULANATE 875-125 MG PO TABS: 1.0000 | ORAL_TABLET | Freq: Two times a day (BID) | ORAL | 0 refills | Status: AC
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
## 2024-02-08 NOTE — Discharge Instructions (Addendum)
 You have been given a shot of Toradol  30 mg today.  Ketorolac  10 mg tablets--take 1 tablet every 6 hours as needed for pain.  This is the same medicine that is in the shot we just gave you  Take amoxicillin -clavulanate 875 mg--1 tab twice daily with food for 7 days   Please follow-up with primary care and with the dental clinic.

## 2024-02-08 NOTE — ED Triage Notes (Signed)
 PT reports he has a dental abscess and Lt neck with shoulder pain. Pt has been taking Ibuprofen  with out relief.

## 2024-02-15 ENCOUNTER — Ambulatory Visit: Admitting: Family Medicine

## 2024-02-17 ENCOUNTER — Other Ambulatory Visit: Payer: Self-pay | Admitting: Family Medicine

## 2024-02-17 DIAGNOSIS — G4700 Insomnia, unspecified: Secondary | ICD-10-CM

## 2024-02-29 ENCOUNTER — Other Ambulatory Visit: Payer: Self-pay

## 2024-02-29 ENCOUNTER — Encounter (HOSPITAL_COMMUNITY): Payer: Self-pay

## 2024-02-29 ENCOUNTER — Ambulatory Visit
Admission: EM | Admit: 2024-02-29 | Discharge: 2024-02-29 | Disposition: A | Discharge status: Home / Self Care | Attending: Emergency Medicine | Admitting: Emergency Medicine

## 2024-02-29 ENCOUNTER — Emergency Department (HOSPITAL_COMMUNITY)
Admission: EM | Admit: 2024-02-29 | Discharge: 2024-02-29 | Disposition: A | Discharge status: Home / Self Care | Attending: Emergency Medicine | Admitting: Emergency Medicine

## 2024-02-29 DIAGNOSIS — Z7984 Long term (current) use of oral hypoglycemic drugs: Secondary | ICD-10-CM | POA: Insufficient documentation

## 2024-02-29 DIAGNOSIS — F17210 Nicotine dependence, cigarettes, uncomplicated: Secondary | ICD-10-CM | POA: Diagnosis not present

## 2024-02-29 DIAGNOSIS — Z7982 Long term (current) use of aspirin: Secondary | ICD-10-CM | POA: Insufficient documentation

## 2024-02-29 DIAGNOSIS — R824 Acetonuria: Secondary | ICD-10-CM | POA: Diagnosis not present

## 2024-02-29 DIAGNOSIS — R739 Hyperglycemia, unspecified: Secondary | ICD-10-CM

## 2024-02-29 DIAGNOSIS — K047 Periapical abscess without sinus: Secondary | ICD-10-CM | POA: Diagnosis not present

## 2024-02-29 DIAGNOSIS — Z79899 Other long term (current) drug therapy: Secondary | ICD-10-CM | POA: Insufficient documentation

## 2024-02-29 DIAGNOSIS — E119 Type 2 diabetes mellitus without complications: Secondary | ICD-10-CM

## 2024-02-29 DIAGNOSIS — E1165 Type 2 diabetes mellitus with hyperglycemia: Secondary | ICD-10-CM | POA: Diagnosis not present

## 2024-02-29 DIAGNOSIS — I10 Essential (primary) hypertension: Secondary | ICD-10-CM | POA: Insufficient documentation

## 2024-02-29 LAB — POCT URINE DIPSTICK
Bilirubin, UA: NEGATIVE
Blood, UA: NEGATIVE
Glucose, UA: >=1000 mg/dL — AB
Leukocytes, UA: NEGATIVE
Nitrite, UA: NEGATIVE
POC PROTEIN,UA: NEGATIVE
Spec Grav, UA: 1.015
Urobilinogen, UA: 0.2 U/dL
pH, UA: 5.5

## 2024-02-29 LAB — CBC WITH DIFFERENTIAL/PLATELET
Abs Immature Granulocytes: 0.02 K/uL (ref 0.00–0.07)
Basophils Absolute: 0.1 K/uL (ref 0.0–0.1)
Basophils Relative: 1 %
Eosinophils Absolute: 0.1 K/uL (ref 0.0–0.5)
Eosinophils Relative: 2 %
HCT: 42.1 % (ref 39.0–52.0)
Hemoglobin: 14 g/dL (ref 13.0–17.0)
Immature Granulocytes: 0 %
Lymphocytes Relative: 24 %
Lymphs Abs: 1.8 K/uL (ref 0.7–4.0)
MCH: 30.4 pg (ref 26.0–34.0)
MCHC: 33.3 g/dL (ref 30.0–36.0)
MCV: 91.5 fL (ref 80.0–100.0)
Monocytes Absolute: 0.7 K/uL (ref 0.1–1.0)
Monocytes Relative: 10 %
Neutro Abs: 4.7 K/uL (ref 1.7–7.7)
Neutrophils Relative %: 63 %
Platelets: 309 K/uL (ref 150–400)
RBC: 4.6 MIL/uL (ref 4.22–5.81)
RDW: 12.4 % (ref 11.5–15.5)
WBC: 7.4 K/uL (ref 4.0–10.5)
nRBC: 0 % (ref 0.0–0.2)

## 2024-02-29 LAB — URINALYSIS, ROUTINE W REFLEX MICROSCOPIC
Bacteria, UA: NONE SEEN
Bilirubin Urine: NEGATIVE
Glucose, UA: >=500 mg/dL — AB
Hgb urine dipstick: NEGATIVE
Ketones, ur: 20 mg/dL — AB
Leukocytes,Ua: NEGATIVE
Nitrite: NEGATIVE
Protein, ur: NEGATIVE mg/dL
Specific Gravity, Urine: 1.03 (ref 1.005–1.030)
pH: 5 (ref 5.0–8.0)

## 2024-02-29 LAB — COMPREHENSIVE METABOLIC PANEL WITH GFR
ALT: 23 U/L (ref 0–44)
AST: 22 U/L (ref 15–41)
Albumin: 3.8 g/dL (ref 3.5–5.0)
Alkaline Phosphatase: 93 U/L (ref 38–126)
Anion gap: 13 (ref 5–15)
BUN: 21 mg/dL (ref 8–23)
CO2: 21 mmol/L — ABNORMAL LOW (ref 22–32)
Calcium: 9.1 mg/dL (ref 8.9–10.3)
Chloride: 97 mmol/L — ABNORMAL LOW (ref 98–111)
Creatinine, Ser: 0.75 mg/dL (ref 0.61–1.24)
GFR, Estimated: >60 mL/min (ref >60)
Glucose, Bld: 468 mg/dL — ABNORMAL HIGH (ref 70–99)
Potassium: 4.6 mmol/L (ref 3.5–5.1)
Sodium: 131 mmol/L — ABNORMAL LOW (ref 135–145)
Total Bilirubin: 1.1 mg/dL (ref 0.0–1.2)
Total Protein: 8 g/dL (ref 6.5–8.1)

## 2024-02-29 LAB — BLOOD GAS, VENOUS
Acid-base deficit: 0.4 mmol/L (ref 0.0–2.0)
Bicarbonate: 26.7 mmol/L (ref 20.0–28.0)
O2 Saturation: 26.2 %
Patient temperature: 37
pCO2, Ven: 53 mmHg (ref 44–60)
pH, Ven: 7.31 (ref 7.25–7.43)
pO2, Ven: <31 mmHg — CL (ref 32–45)

## 2024-02-29 LAB — CBG MONITORING, ED
Glucose-Capillary: 468 mg/dL — ABNORMAL HIGH (ref 70–99)
Glucose-Capillary: 308 mg/dL — ABNORMAL HIGH (ref 70–99)

## 2024-02-29 LAB — GLUCOSE, POCT (MANUAL RESULT ENTRY): POC Glucose: 391 mg/dL — AB (ref 70–99)

## 2024-02-29 LAB — BETA-HYDROXYBUTYRIC ACID: Beta-Hydroxybutyric Acid: 2.04 mmol/L — ABNORMAL HIGH (ref 0.05–0.27)

## 2024-02-29 MED ORDER — INSULIN GLARGINE-YFGN 100 UNIT/ML ~~LOC~~ SOLN: 5.0000 [IU] | Freq: Every day | SUBCUTANEOUS | Status: DC

## 2024-02-29 MED ORDER — LACTATED RINGERS IV BOLUS
1000.0000 mL | Freq: Once | INTRAVENOUS | Status: AC
  Administered 2024-02-29 (×2): 1000 mL via INTRAVENOUS

## 2024-02-29 MED ORDER — INSULIN ASPART PROT & ASPART (70-30 MIX) 100 UNIT/ML ~~LOC~~ SUSP
10.0000 [IU] | Freq: Once | SUBCUTANEOUS | Status: DC
  Filled 2024-02-29: qty 10

## 2024-02-29 MED ORDER — SODIUM CHLORIDE 0.9 % IV BOLUS
1000.0000 mL | Freq: Once | INTRAVENOUS | Status: AC
  Administered 2024-02-29 (×2): 1000 mL via INTRAVENOUS

## 2024-02-29 MED ORDER — AMOXICILLIN-POT CLAVULANATE 875-125 MG PO TABS: 1.0000 | ORAL_TABLET | Freq: Two times a day (BID) | ORAL | 0 refills | Status: DC

## 2024-02-29 MED ORDER — INSULIN GLARGINE-YFGN 100 UNIT/ML ~~LOC~~ SOLN
5.0000 [IU] | Freq: Once | SUBCUTANEOUS | Status: AC
  Administered 2024-02-29 (×2): 5 [IU] via SUBCUTANEOUS
  Filled 2024-02-29: qty 0.05

## 2024-02-29 NOTE — ED Notes (Signed)
 Patient is being discharged from the Urgent Care and sent to the Emergency Department via POV . Per Van, MD, patient is in need of higher level of care due to need for further evaluation of potential DKA. Patient is aware and verbalizes understanding of plan of care.  Vitals:   02/29/24 1249  BP: 111/73  Pulse: 87  Resp: 18  Temp: 97.8 F (36.6 C)  SpO2: 97%

## 2024-02-29 NOTE — ED Notes (Signed)
 Pt and family upset about wait time, was advised that we are busy and that we are trying to get them helped as soon as we can. Pts family rude, and asking to speak to someone due to the wait. This Clinical research associate offered to retake vitals and sugar but pt refused.

## 2024-02-29 NOTE — Discharge Instructions (Addendum)
 Your glucose was 391 here, you have over thousand glucose in your urine and ketones in your urine as well.  I am concerned that you are heading into DKA/diabetic ketoacidosis.  This is most likely from your dental infection.  I am transferring you to the emergency department for further evaluation and management.    Please see below for: [facility] for

## 2024-02-29 NOTE — Discharge Instructions (Addendum)
 While you were in the emergency room, your blood sugar was noted to be elevated.  It came down with IV fluids.  He did receive a dose of insulin  here in the ED.  We have sent a message to your primary care doctor to discuss tighter control of your blood sugar.  Continue to you take your metformin  as prescribed.  Avoid carbohydrates.  Please review the literature that you are being discharged with.  Return to the emergency room if you develop pain in your abdomen, inability eat or drink or confusion.

## 2024-02-29 NOTE — ED Provider Notes (Signed)
 [place] EMERGENCY DEPARTMENT AT [facility] Provider Note  CSN: [id number] Arrival date & time: 02/29/24 1637  Chief Complaint(s) Hyperglycemia  HPI Jose Deleon is a 63 y.o. male who is here today due to hyperglycemia.  Patient says that he has been drinking more water frequently.  He currently takes 1000 g of metformin  2 times per day.  Denies any fever, chills.  Has been being treated for a dental infection, has not been on antibiotics for last 2 weeks.   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 Authorizing Provider  amLODipine -benazepril  (LOTREL) 10-20 MG capsule Take 1 capsule by mouth daily. 01/28/24   Tanda Bleacher, MD  amLODipine -benazepril  (LOTREL) 5-20 MG capsule Take 1 capsule by mouth daily. 10/06/23   Tanda Bleacher, MD  aspirin  81 MG tablet Take 1 tablet (81 mg total) by mouth daily. 03/16/18   Fulp, Cammie, MD  Blood Glucose Monitoring Suppl (TRUERESULT BLOOD GLUCOSE) w/Device KIT 1 each by Does not apply route daily. 03/16/18   Fulp, Cammie, MD  cholecalciferol (VITAMIN D3) 25 MCG (1000 UNIT) tablet Take 1,000 Units by mouth daily.    [provider]  Cinnamon 500 MG TABS Take 500 mg by  mouth daily.    [provider]  Garlic 100 MG TABS Take 100 mg by mouth every morning.    [provider]  glucose blood (TRUETEST TEST) test strip 1 each by Other route 2 (two) times daily. Use as instructed 03/16/18   Fulp, Cammie, MD  ketoconazole  (NIZORAL ) 2 % cream Apply 1 Application topically daily. Patient taking differently: Apply 1 Application topically every other day. Applies to toes 06/07/23   Standiford, Marsa FALCON, DPM  ketorolac  (TORADOL ) 10 MG tablet Take 1 tablet (10 mg total) by mouth every 6 (six) hours as needed (pain). 02/08/24   Vonna Sharlet POUR, MD  metFORMIN  (GLUCOPHAGE ) 1000 MG tablet Take 1 tablet (1,000 mg total) by mouth 2 (two) times daily with a meal. 01/28/24   Tanda Bleacher, MD  Multiple Vitamin (MULTIVITAMIN WITH MINERALS) TABS tablet Take 1 tablet by mouth daily.    [provider]  QUEtiapine  (SEROQUEL ) 50 MG tablet TAKE 1 TABLET BY MOUTH AT BEDTIME 02/26/24   Tanda Bleacher, MD  sildenafil  (VIAGRA ) 100 MG tablet TAKE 1 TABLET BY MOUTH AS NEEDED FOR ERECTILE DYSFUNCTION 01/15/24   Tanda Bleacher, MD  Past Surgical History Past Surgical History:  Procedure Laterality Date   CARDIAC CATHETERIZATION N/A 03/13/2016   Procedure: Left Heart Cath and Coronary Angiography;  Surgeon: Peter M Swaziland, MD;  Location: [facility] INVASIVE CV LAB;  Service: Cardiovascular;  Laterality: N/A;   HEMORRHOID SURGERY  1981   lanced   Family History Family History  Problem Relation Age of Onset   Hypertension Mother    Heart attack Mother 72   Hypertension Father 46   Multiple sclerosis Father    Stroke Maternal Grandmother     Social History Social History   Tobacco Use   Smoking status: Light Smoker    Current packs/day: 0.00    Average packs/day: 0.3 packs/day for 30.0 years (9.9 ttl pk-yrs)    Types: Cigarettes     Start date: 03/07/1986    Last attempt to quit: 03/07/2016    Years since quitting: 7.9   Smokeless tobacco: Never   Tobacco comments:    1-2 cigs a day  Vaping Use   Vaping status: Never Used  Substance Use Topics   Alcohol use: Yes    Alcohol/week: 4.0 standard drinks of alcohol    Types: 4 Cans of beer per week   Drug use: No   Allergies Patient has no known allergies.  Review of Systems Review of Systems  Physical Exam Vital Signs  I have reviewed the triage vital signs BP 135/89 (BP Location: Right Arm)   Pulse 76   Temp 97.9 F (36.6 C) (Oral)   Resp (!) 24   SpO2 96%   Physical Exam Vitals and nursing note reviewed.  HENT:     Head: Normocephalic.  Eyes:     Pupils: Pupils are equal, round, and reactive to light.  Cardiovascular:     Rate and Rhythm: Normal rate.  Pulmonary:     Effort: Pulmonary effort is normal.  Abdominal:     General: Abdomen is flat. There is no distension.     Palpations: Abdomen is soft.     Tenderness: There is no abdominal tenderness.  Musculoskeletal:        General: Normal range of motion.     Cervical back: Normal range of motion.  Skin:    General: Skin is warm.  Neurological:     General: No focal deficit present.     ED Results and Treatments Labs (all labs ordered are listed, but only abnormal results are displayed) Labs Reviewed  COMPREHENSIVE METABOLIC PANEL WITH GFR - Abnormal; Notable for the following components:      Result Value   Sodium 131 (*)    Chloride 97 (*)    CO2 21 (*)    Glucose, Bld 468 (*)    All other components within normal limits  URINALYSIS, ROUTINE W REFLEX MICROSCOPIC - Abnormal; Notable for the following components:   Color, Urine STRAW (*)    Glucose, UA >=500 (*)    Ketones, ur 20 (*)    All other components within normal limits  BLOOD GAS, VENOUS - Abnormal; Notable for the following components:   pO2, Ven <31 (*)    All other components within normal limits   BETA-HYDROXYBUTYRIC ACID - Abnormal; Notable for the following components:   Beta-Hydroxybutyric Acid 2.04 (*)    All other components within normal limits  CBG MONITORING, ED - Abnormal; Notable for the following components:   Glucose-Capillary 468 (*)    All other components within normal limits  CBC WITH DIFFERENTIAL/PLATELET  Radiology No results found.  Pertinent labs & imaging results that were available during my care of the patient were reviewed by me and considered in my medical decision making (see MDM for details).  Medications Ordered in ED Medications  sodium chloride  0.9 % bolus 1,000 mL (1,000 mLs Intravenous New Bag/Given 02/29/24 2126)  lactated ringers  bolus 1,000 mL (1,000 mLs Intravenous New Bag/Given 02/29/24 2126)  insulin  glargine-yfgn (SEMGLEE ) injection 5 Units (5 Units Subcutaneous Given 02/29/24 2254)                                                                                                                                     Procedures Procedures  (including critical care time)  Medical Decision Making / ED Course   This patient presents to the ED for concern of hyperglycemia,, this involves an extensive number of treatment options, and is a complaint that carries with it a high risk of complications and morbidity.  The differential diagnosis includes hyperglycemia, less likely acidosis.  MDM: Patient here today with hyperglycemia.  He has a normal bicarb, no anion gap.  Mildly elevated beta hydroxybutyrate.  Not acidotic.  This patient does not meet DKA criteria.  Blood sugar came down with IV fluids alone.  Did give him some long-acting insulin .  Patient currently maxed on his metformin .  Sent a message to the patient's PCP for follow-up for tighter glycemic control.  Looked in the patient's mouth, dental caries appear chronic.   Will restart him on Augmentin .  Will discharge patient.  Additional history obtained:  -External records from outside source obtained and reviewed including: Chart review including previous notes, labs, imaging, consultation notes   Lab Tests: -I ordered, reviewed, and interpreted labs.   The pertinent results include:   Labs Reviewed  COMPREHENSIVE METABOLIC PANEL WITH GFR - Abnormal; Notable for the following components:      Result Value   Sodium 131 (*)    Chloride 97 (*)    CO2 21 (*)    Glucose, Bld 468 (*)    All other components within normal limits  URINALYSIS, ROUTINE W REFLEX MICROSCOPIC - Abnormal; Notable for the following components:   Color, Urine STRAW (*)    Glucose, UA >=500 (*)    Ketones, ur 20 (*)    All other components within normal limits  BLOOD GAS, VENOUS - Abnormal; Notable for the following components:   pO2, Ven <31 (*)    All other components within normal limits  BETA-HYDROXYBUTYRIC ACID - Abnormal; Notable for the following components:   Beta-Hydroxybutyric Acid 2.04 (*)    All other components within normal limits  CBG MONITORING, ED - Abnormal; Notable for the following components:   Glucose-Capillary 468 (*)    All other components within normal limits  CBC WITH DIFFERENTIAL/PLATELET       Medicines ordered and prescription drug management: Meds ordered this encounter  Medications   sodium  chloride 0.9 % bolus 1,000 mL   lactated ringers  bolus 1,000 mL   DISCONTD: insulin  aspart protamine- aspart (NOVOLOG  MIX 70/30) injection 10 Units   DISCONTD: insulin  glargine-yfgn (SEMGLEE ) injection 5 Units   insulin  glargine-yfgn (SEMGLEE ) injection 5 Units    -I have reviewed the patients home medicines and have made adjustments as needed   Cardiac Monitoring: The patient was maintained on a cardiac monitor.  I personally viewed and interpreted the cardiac monitored which showed an underlying rhythm of: Normal sinus rhythm  Social  Determinants of Health:  Factors impacting patients care include: Lack of access to primary care   Reevaluation: After the interventions noted above, I reevaluated the patient and found that they have :improved  Co morbidities that complicate the patient evaluation  Past Medical History:  Diagnosis Date   Callus of toe 2024   Capsulitis of foot, right 2024   FH: heart disease    GERD (gastroesophageal reflux disease)    Hav (hallux abducto valgus), right 2024   High cholesterol    History of gout    reaction from the TB RX   Hypertension 2015   TB (pulmonary tuberculosis) 1990   Type II diabetes mellitus (HCC) dx ~ 2015      Dispostion: I considered admission for this patient, however he is appropriate for discharge and outpatient follow-up.     Final Clinical Impression(s) / ED Diagnoses Final diagnoses:  Hyperglycemia     @PCDICTATION @    Mannie Pac T, DO 02/29/24 2316

## 2024-02-29 NOTE — ED Triage Notes (Signed)
 Pt was sent over by UC due to hyperglycemia. Pt has been drinking and urinating more frequently.

## 2024-02-29 NOTE — ED Provider Notes (Signed)
 HPI  SUBJECTIVE:  Jose Deleon is a 63 y.o. male who presents with hyperglycemia noted this morning.  He states his fasting glucose was 380.  He has noted urinary frequency, increased thirst and general fatigue for the past few days.  No unintentional weight loss, chest pain, shortness of breath, abdominal pain, nausea, vomiting.  He was recently treated for dental abscess with Augmentin  and Toradol .  States the dental pain never fully resolved.  He reports persistent gingival and facial swelling.  No fevers, drooling, trismus, swelling in the tongue or jaw.  He tried increasing his metformin  to 2 tabs yesterday.  He normally takes 1 a day.  No aggravating or alleviating factors.  He has a past history of well-controlled diabetes.  His normal glucose ranges 125-140 and he is compliant with his metformin .  He also has a past medical history of hypertension, hypercholesterolemia, TB.  No history of DKA.  PCP: [facility].   He was seen at the [facility] urgent care on 7/21 for dental infection, sent home on 7 days of Augmentin  and Toradol .  Past Medical History:  Diagnosis Date   Callus of toe 2024   Capsulitis of foot, right 2024   FH: heart disease    GERD (gastroesophageal reflux disease)    Hav (hallux abducto valgus), right 2024   High cholesterol    History of gout    reaction from the TB RX   Hypertension 2015   TB (pulmonary tuberculosis) 1990   Type II diabetes mellitus (HCC) dx ~ 2015    Past Surgical History:  Procedure Laterality Date   CARDIAC CATHETERIZATION N/A 03/13/2016   Procedure: Left Heart Cath and Coronary Angiography;  Surgeon: Peter M Swaziland, MD;  Location: [facility] INVASIVE CV LAB;  Service: Cardiovascular;  Laterality: N/A;   HEMORRHOID SURGERY  1981   lanced    Family History  Problem Relation Age of Onset   Hypertension Mother    Heart attack Mother 14   Hypertension Father 1   Multiple sclerosis Father    Stroke Maternal Grandmother     Social  History   Tobacco Use   Smoking status: Light Smoker    Current packs/day: 0.00    Average packs/day: 0.3 packs/day for 30.0 years (9.9 ttl pk-yrs)    Types: Cigarettes    Start date: 03/07/1986    Last attempt to quit: 03/07/2016    Years since quitting: 7.9   Smokeless tobacco: Never   Tobacco comments:    1-2 cigs a day  Vaping Use   Vaping status: Never Used  Substance Use Topics   Alcohol use: Yes    Alcohol/week: 4.0 standard drinks of alcohol    Types: 4 Cans of beer per week   Drug use: No    No current facility-administered medications for this encounter.  Current Outpatient Medications:    amLODipine -benazepril  (LOTREL) 10-20 MG capsule, Take 1 capsule by mouth daily., Disp: 90 capsule, Rfl: 1   amLODipine -benazepril  (LOTREL) 5-20 MG capsule, Take 1 capsule by mouth daily., Disp: 30 capsule, Rfl: 0   aspirin  81 MG tablet, Take 1 tablet (81 mg total) by mouth daily., Disp: 90 tablet, Rfl: 3   cholecalciferol (VITAMIN D3) 25 MCG (1000 UNIT) tablet, Take 1,000 Units by mouth daily., Disp: , Rfl:    Cinnamon 500 MG TABS, Take 500 mg by mouth daily., Disp: , Rfl:    Garlic 100 MG TABS, Take 100 mg by mouth every morning., Disp: , Rfl:  glucose blood (TRUETEST TEST) test strip, 1 each by Other route 2 (two) times daily. Use as instructed, Disp: 180 each, Rfl: 3   ketorolac  (TORADOL ) 10 MG tablet, Take 1 tablet (10 mg total) by mouth every 6 (six) hours as needed (pain)., Disp: 20 tablet, Rfl: 0   metFORMIN  (GLUCOPHAGE ) 1000 MG tablet, Take 1 tablet (1,000 mg total) by mouth 2 (two) times daily with a meal., Disp: 180 tablet, Rfl: 0   Multiple Vitamin (MULTIVITAMIN WITH MINERALS) TABS tablet, Take 1 tablet by mouth daily., Disp: , Rfl:    QUEtiapine  (SEROQUEL ) 50 MG tablet, TAKE 1 TABLET BY MOUTH AT BEDTIME, Disp: 30 tablet, Rfl: 0   sildenafil  (VIAGRA ) 100 MG tablet, TAKE 1 TABLET BY MOUTH AS NEEDED FOR ERECTILE DYSFUNCTION, Disp: 15 tablet, Rfl: 0   Blood Glucose Monitoring  Suppl (TRUERESULT BLOOD GLUCOSE) w/Device KIT, 1 each by Does not apply route daily., Disp: 1 each, Rfl: 0   ketoconazole  (NIZORAL ) 2 % cream, Apply 1 Application topically daily. (Patient taking differently: Apply 1 Application topically every other day. Applies to toes), Disp: 30 g, Rfl: 0  No Known Allergies   ROS  As noted in HPI.   Physical Exam  BP 111/73 (BP Location: Left Arm)   Pulse 87   Temp 97.8 F (36.6 C) (Oral)   Resp 18   Ht 6' 3 (1.905 m)   Wt 88.5 kg   SpO2 97%   BMI 24.39 kg/m   Constitutional: Well developed, well nourished, no acute distress Eyes: PERRL, EOMI, conjunctiva normal bilaterally HENT: Normocephalic, atraumatic,mucus membranes moist.  Extensively poor dentition.  Right lower teeth decayed to gum, tenderness to palpation.  Positive gingival swelling with no expressible purulent drainage.  No swelling under the jaw or tongue.  Mild right lower facial swelling.  No drooling, trismus. Neck: No cervical lymphadenopathy Respiratory: Clear to auscultation bilaterally, no rales, no wheezing, no rhonchi Cardiovascular: Normal rate and rhythm, no murmurs, no gallops, no rubs GI: Soft, nondistended, normal bowel sounds, nontender, no rebound, no guarding Back: no CVAT skin: No rash, skin intact Musculoskeletal: No edema, no tenderness, no deformities Neurologic: Alert & oriented x 3, CN III-XII grossly intact, no motor deficits, sensation grossly intact Psychiatric: Speech and behavior appropriate   ED Course   Medications - No data to display  Orders Placed This Encounter  Procedures   POCT CBG (manual entry)    Standing Status:   Standing    Number of Occurrences:   1   POCT URINE DIPSTICK    Standing Status:   Standing    Number of Occurrences:   1   Results for orders placed or performed during the hospital encounter of 02/29/24 (from the past 24 hours)  POCT CBG (manual entry)     Status: Abnormal   Collection Time: 02/29/24 12:53 PM   Result Value Ref Range   POC Glucose 391 (A) 70 - 99 mg/dl  POCT URINE DIPSTICK     Status: Abnormal   Collection Time: 02/29/24  1:46 PM  Result Value Ref Range   Color, UA yellow yellow   Clarity, UA clear clear   Glucose, UA >=1,000 (A) negative mg/dL   Bilirubin, UA negative negative   Ketones, POC UA small (15) (A) negative mg/dL   Spec Grav, UA 8.984 8.989 - 1.025   Blood, UA negative negative   pH, UA 5.5 5.0 - 8.0   POC PROTEIN,UA negative negative, trace   Urobilinogen, UA 0.2 0.2 or  1.0 E.U./dL   Nitrite, UA Negative Negative   Leukocytes, UA Negative Negative   No results found.  ED Clinical Impression  1. Hyperglycemia   2. Type 2 diabetes mellitus without complication, without long-term current use of insulin  (HCC)   3. Urine ketones   4. Dental infection      ED Assessment/Plan     Previous records reviewed.  As noted in HPI.  1.  Hyperglycemia.  Patient is hyperglycemic at 391, he has small ketones in his urine.  Concern for DKA.  Transferring to the [facility] emergency department to rule this out as and bring his sugar down.  He is stable to go by private vehicle.  2.  Dental infection.  Did not fully resolve with the Augmentin .  No evidence of Ludwig's angina.  He may benefit from additional antibiotics.  We are giving him additional dental resources for follow-up.  Discussed labs, rationale for transfer to the emergency department with the patient.  He agrees to go. No orders of the defined types were placed in this encounter.     *This clinic note was created using Dragon dictation software. Therefore, there may be occasional mistakes despite careful proofreading. ?    Van Knee, MD 02/29/24 1429

## 2024-02-29 NOTE — ED Triage Notes (Addendum)
 My blood sugar spiked this morning (result of 380)  & I have an abscess in my tooth. I reached out to Dr. Luigi office they said to come back when they came back from lunch. Current symptoms drowsiness, peeing a lot all night, last night. No nausea or vomiting.   Recent Dental office appt (& treated) with Augmenting/Toradol .

## 2024-03-02 ENCOUNTER — Ambulatory Visit: Payer: Self-pay

## 2024-03-02 ENCOUNTER — Ambulatory Visit
Admission: EM | Admit: 2024-03-02 | Discharge: 2024-03-02 | Disposition: A | Discharge status: Home / Self Care | Attending: Family Medicine | Admitting: Family Medicine

## 2024-03-02 ENCOUNTER — Encounter: Payer: Self-pay | Admitting: Emergency Medicine

## 2024-03-02 DIAGNOSIS — R739 Hyperglycemia, unspecified: Secondary | ICD-10-CM

## 2024-03-02 DIAGNOSIS — E1165 Type 2 diabetes mellitus with hyperglycemia: Secondary | ICD-10-CM | POA: Diagnosis not present

## 2024-03-02 LAB — POCT URINE DIPSTICK
Bilirubin, UA: NEGATIVE
Blood, UA: NEGATIVE
Glucose, UA: 500 mg/dL — AB
Leukocytes, UA: NEGATIVE
Nitrite, UA: NEGATIVE
POC PROTEIN,UA: NEGATIVE
Spec Grav, UA: 1.02 (ref 1.010–1.025)
Urobilinogen, UA: 0.2 U/dL
pH, UA: 6 (ref 5.0–8.0)

## 2024-03-02 LAB — GLUCOSE, POCT (MANUAL RESULT ENTRY)
POCT Glucose (KUC): 339 mg/dL — AB (ref 70–99)
POCT Glucose (KUC): 273 mg/dL — AB (ref 70–99)

## 2024-03-02 MED ORDER — INSULIN GLARGINE 100 UNIT/ML ~~LOC~~ SOLN: 10.0000 [IU] | Freq: Every day | SUBCUTANEOUS | 0 refills | Status: AC

## 2024-03-02 MED ORDER — INSULIN PEN NEEDLE 30G X 8 MM MISC: 1.0000 | 0 refills | Status: AC | PRN

## 2024-03-02 MED ORDER — SODIUM CHLORIDE 0.9 % IV BOLUS
1000.0000 mL | Freq: Once | INTRAVENOUS | Status: AC
  Administered 2024-03-02 (×2): 1000 mL via INTRAVENOUS

## 2024-03-02 NOTE — ED Triage Notes (Signed)
 Pt presents c/o hyperglycemia. Pt says he was in office 2 days ago and went to the ER. Pt says he was given insulin  and sent home. This morning, pt says he only had decaffeinated coffee with zero sugar creamer and his blood sugar was still elevated. Pt c/o being extremely fatigued and frequent urination, still.

## 2024-03-02 NOTE — Discharge Instructions (Signed)
 Keep your PCP follow up tomorrow morning.  You have had labs (blood work related to diabetes) sent today. We will call you with any significant abnormalities or if there is need to begin or change treatment or pursue further follow up.  You may also review your test results online through MyChart. If you do not have a MyChart account, instructions to sign up should be on your discharge paperwork.

## 2024-03-02 NOTE — Telephone Encounter (Signed)
 FYI Only or Action Required?: Action required by provider: request for appointment.  Patient was last seen in primary care on 10/23/2023 by Tanda Bleacher, MD.  Called Nurse Triage reporting Blood Sugar Problem.  Symptoms began today.  Interventions attempted: Prescription medications: metformin .  Symptoms are: gradually worsening.  Triage Disposition: See Physician Within 24 Hours  Patient/caregiver understands and will follow disposition?: UnsureCopied from CRM #[id number]. Topic: Clinical - Red Word Triage >> Mar 02, 2024  8:04 AM Everette C wrote: [facility] that prompted transfer to Nurse Triage: The patient has called to share that their blood sugar has elevated from 290 to 347 overnight without any food. The patient would like to speak with a member of staff about their concerns Reason for Disposition  [1] Symptoms of high blood sugar (e.g., increased thirst, frequent urination, weight loss) AND [2] not able to test blood glucose  Answer Assessment - Initial Assessment Questions Pt calling with concerns of elevated BS. Last night it was 290 and upon waking up it was 347 without eating/drinking. Pt was seen in ED on 8/11 for same concern. Pt already has f/u appt set for 8/13. Pt wants to be seen today. No office appts. RN suggested [employer] and pt is upset. I'm suppose to go get help at a homeless shelter? Pt hung up. CAL notified. Please advise       1. BLOOD GLUCOSE: What is your blood glucose level?      347 2. ONSET: When did you check the blood glucose?     This morning 3. USUAL RANGE: What is your glucose level usually? (e.g., usual fasting morning value, usual evening value)     130-150 4. KETONES: Do you check for ketones (urine or blood test strips)? If Yes, ask: What does the test show now?      na 5. TYPE 1 or 2:  Do you know what type of diabetes you have?  (e.g., Type 1, Type 2, Gestational; doesn't know)      na 6. INSULIN : Do you take  insulin ? What type of insulin (s) do you use? What is the mode of delivery? (syringe, pen; injection or pump)?      no 7. DIABETES PILLS: Do you take any pills for your diabetes? If Yes, ask: Have you missed taking any pills recently?     metformin  8. OTHER SYMPTOMS: Do you have any symptoms? (e.g., fever, frequent urination, difficulty breathing, dizziness, weakness, vomiting)     Frequent urination and fatigue  Protocols used: Diabetes - High Blood Sugar-A-AH

## 2024-03-02 NOTE — ED Provider Notes (Signed)
 [facility]   [id number] 03/02/24 Arrival Time: 9047  ASSESSMENT & PLAN:  1. Type 2 diabetes mellitus with hyperglycemia, unspecified whether long term insulin  use (HCC)   2. Hyperglycemia    Sugar has come down a bit after IVF. He is feeling good.   Labs Reviewed  GLUCOSE, POCT (MANUAL RESULT ENTRY) - Abnormal; Notable for the following components:      Result Value   POCT Glucose (KUC) 339 (*)    All other components within normal limits  POCT URINE DIPSTICK - Abnormal; Notable for the following components:   Glucose, UA =500 (*)    Ketones, POC UA small (15) (*)    All other components within normal limits  GLUCOSE, POCT (MANUAL RESULT ENTRY) - Abnormal; Notable for the following components:   POCT Glucose (KUC) 273 (*)    All other components within normal limits   Pending: COMPREHENSIVE METABOLIC PANEL WITH GFR  BETA-HYDROXYBUTYRIC ACID  HEMOGLOBIN A1C   Begin Lantus . Meds ordered this encounter  Medications   sodium chloride  0.9 % bolus 1,000 mL   insulin  glargine (LANTUS ) 100 UNIT/ML injection    Sig: Inject 0.1 mLs (10 Units total) into the skin daily.    Dispense:  10 mL    Refill:  0   Insulin  Pen Needle (NOVOFINE) 30G X 8 MM MISC    Sig: Inject 10 each into the skin as needed.    Dispense:  100 each    Refill:  0   Has PCP f/u tomorrow morning.   Follow-up Information     [facility].   Specialty: Emergency Medicine Why: If symptoms worsen in any way. Contact information: [address] [place]  [phone]                Reviewed expectations re: course of current medical issues. Questions answered. Outlined signs and symptoms indicating need for more acute intervention. Patient verbalized understanding. After Visit Summary given.   SUBJECTIVE: History from: patient. Jose Deleon is a 63 y.o. male who presents with hyperglycemia. Pt states he  was seen here two days ago and sent to the ER. Treated with insulin  and told to arrange PCP f/u. Blood sugar around 270 before bed yesterday evening; mid 300's this morning. Worried it's still so high. Still thirsty with freq urination. Denies n/v/abd pain. Has not been eating secondary to blood sugars. Trying to stay hydrated. Very fatigued.  OBJECTIVE:  Vitals:   03/02/24 1005 03/02/24 1006  BP:  (!) 165/94  Pulse:  94  Resp:  18  Temp:  98.3 F (36.8 C)  TempSrc:  Oral  SpO2:  95%  Weight: 88.5 kg     General appearance: alert; no distress Eyes: PERRLA; EOMI; conjunctiva normal HENT: normocephalic; atraumatic Lungs: clear to auscultation bilaterally; unlabored Heart: regular Abdomen: soft Extremities: no edema; symmetrical with no gross deformities Skin: warm and dry Neurologic: normal gait; normal symmetric reflexes Psychological: alert and cooperative; normal mood and affect  Labs: Results for orders placed or performed during the hospital encounter of 03/02/24  POCT CBG (manual entry)   Collection Time: 03/02/24 10:07 AM  Result Value Ref Range   POCT Glucose (KUC) 339 (A) 70 - 99 mg/dL  POCT URINE DIPSTICK   Collection Time: 03/02/24 10:07 AM  Result Value Ref Range   Color, UA yellow yellow   Clarity, UA clear clear   Glucose, UA =500 (A) negative mg/dL   Bilirubin,  UA negative negative   Ketones, POC UA small (15) (A) negative mg/dL   Spec Grav, UA 8.979 8.989 - 1.025   Blood, UA negative negative   pH, UA 6.0 5.0 - 8.0   POC PROTEIN,UA negative negative, trace   Urobilinogen, UA 0.2 0.2 or 1.0 E.U./dL   Nitrite, UA Negative Negative   Leukocytes, UA Negative Negative  POCT CBG (manual entry)   Collection Time: 03/02/24 12:15 PM  Result Value Ref Range   POCT Glucose (KUC) 273 (A) 70 - 99 mg/dL   Labs Reviewed  GLUCOSE, POCT (MANUAL RESULT ENTRY) - Abnormal; Notable for the following components:      Result Value   POCT Glucose (KUC) 339 (*)    All  other components within normal limits  POCT URINE DIPSTICK - Abnormal; Notable for the following components:   Glucose, UA =500 (*)    Ketones, POC UA small (15) (*)    All other components within normal limits  GLUCOSE, POCT (MANUAL RESULT ENTRY) - Abnormal; Notable for the following components:   POCT Glucose (KUC) 273 (*)    All other components within normal limits  COMPREHENSIVE METABOLIC PANEL WITH GFR  BETA-HYDROXYBUTYRIC ACID  HEMOGLOBIN A1C    Imaging: No results found.  Allergies  Allergen Reactions   Augmentin  [Amoxicillin -Pot Clavulanate] Diarrhea    Past Medical History:  Diagnosis Date   Callus of toe 2024   Capsulitis of foot, right 2024   FH: heart disease    GERD (gastroesophageal reflux disease)    Hav (hallux abducto valgus), right 2024   High cholesterol    History of gout    reaction from the TB RX   Hypertension 2015   TB (pulmonary tuberculosis) 1990   Type II diabetes mellitus (HCC) dx ~ 2015   Social History   Socioeconomic History   Marital status: Single    Spouse name: Not on file   Number of children: Not on file   Years of education: Not on file   Highest education level: Associate degree: occupational, Scientist, product/process development, or vocational program  Occupational History   Occupation: Retired Editor, 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  Tobacco Use   Smoking status: Light Smoker    Current packs/day: 0.00    Average packs/day: 0.3 packs/day for 30.0 years (9.9 ttl pk-yrs)    Types: Cigarettes    Start date: 03/07/1986    Last attempt to quit: 03/07/2016    Years since quitting: 7.9    Passive exposure: Current   Smokeless tobacco: Never   Tobacco comments:    1-2 cigs a day  Vaping Use   Vaping status: Never Used  Substance and Sexual Activity   Alcohol use: Yes    Alcohol/week: 4.0 standard drinks of alcohol    Types: 4 Cans of beer per week   Drug use: No   Sexual activity: Not Currently  Other Topics Concern   Not on file  Social History Narrative   Not on  file   Social Drivers of Health   Financial Resource Strain: Low Risk  (10/22/2023)   Overall Financial Resource Strain (CARDIA)    Difficulty of Paying Living Expenses: Not very hard  Food Insecurity: No Food Insecurity (10/22/2023)   Hunger Vital Sign    Worried About Running Out of Food in the Last Year: Never true    Ran Out of Food in the Last Year: Never true  Transportation Needs: No Transportation Needs (10/23/2023)   PRAPARE - Transportation    Lack of  Transportation (Medical): No    Lack of Transportation (Non-Medical): No  Physical Activity: Inactive (10/23/2023)   Exercise Vital Sign    Days of Exercise per Week: 0 days    Minutes of Exercise per Session: 0 min  Stress: Stress Concern Present (10/22/2023)   [employer] of Occupational Health - Occupational Stress Questionnaire    Feeling of Stress : To some extent  Social Connections: Unknown (10/23/2023)   Social Connection and Isolation Panel    Frequency of Communication with Friends and Family: More than three times a week    Frequency of Social Gatherings with Friends and Family: Once a week    Attends Religious Services: Patient declined    Database administrator or Organizations: No    Attends Banker Meetings: Never    Marital Status: Never married  Intimate Partner Violence: Not At Risk (10/23/2023)   Humiliation, Afraid, Rape, and Kick questionnaire    Fear of Current or Ex-Partner: No    Emotionally Abused: No    Physically Abused: No    Sexually Abused: No   Family History  Problem Relation Age of Onset   Hypertension Mother    Heart attack Mother 85   Hypertension Father 58   Multiple sclerosis Father    Stroke Maternal Grandmother    Past Surgical History:  Procedure Laterality Date   CARDIAC CATHETERIZATION N/A 03/13/2016   Procedure: Left Heart Cath and Coronary Angiography;  Surgeon: Peter M Swaziland, MD;  Location: [facility] INVASIVE [facility];  Service: Cardiovascular;  Laterality: N/A;    HEMORRHOID SURGERY  1981   lanced     Rolinda Rogue, MD 03/02/24 1328

## 2024-03-03 ENCOUNTER — Ambulatory Visit (INDEPENDENT_AMBULATORY_CARE_PROVIDER_SITE_OTHER)

## 2024-03-03 VITALS — BP 127/76 | HR 71 | Wt 188.8 lb

## 2024-03-03 DIAGNOSIS — Z9189 Other specified personal risk factors, not elsewhere classified: Secondary | ICD-10-CM

## 2024-03-03 DIAGNOSIS — Z91849 Unspecified risk for dental caries: Secondary | ICD-10-CM | POA: Diagnosis not present

## 2024-03-03 DIAGNOSIS — Z7984 Long term (current) use of oral hypoglycemic drugs: Secondary | ICD-10-CM

## 2024-03-03 DIAGNOSIS — Z794 Long term (current) use of insulin: Secondary | ICD-10-CM | POA: Diagnosis not present

## 2024-03-03 DIAGNOSIS — E119 Type 2 diabetes mellitus without complications: Secondary | ICD-10-CM

## 2024-03-03 LAB — POCT GLYCOSYLATED HEMOGLOBIN (HGB A1C): HbA1c, POC (controlled diabetic range): 12.1 % — AB (ref 0.0–7.0)

## 2024-03-03 MED ORDER — AMOXICILLIN 500 MG PO CAPS: 500.0000 mg | ORAL_CAPSULE | Freq: Three times a day (TID) | ORAL | 0 refills | Status: AC

## 2024-03-03 NOTE — Progress Notes (Signed)
   Patient ID: [name], male    DOB: 16-Nov-1960  MRN: [id number]  CC: Hospitalization Follow-up, Medication Management, and Dizziness (From high bs )   Subjective: Jose Deleon is a 63 y.o. male who presents to clinic for follow up post discharge from hospital for hyperglycemia. Pt was at urgent care yesterday due to elevated glucose readings. Has not started insulin  and has not taken metformin  today. Wishes to receive more education regarding medication regimen.    ROS: Review of Systems Negative except as stated above  PHYSICAL EXAM: BP 127/76   Pulse 71   Wt 188 lb 12.8 oz (85.6 kg)   SpO2 93%   BMI 23.60 kg/m   Physical Exam  General: well-appearing, no acute distress Skin: no jaundice, rashes, or lesions Cardiovascular: regular heart rate and rhythm, normal S1/S2, no murmurs, gallops, or rubs, peripheral pulses 2+ bilaterally Chest: no skeletal deformity, lungs clear to auscultation bilaterally, equal breath sounds bilaterally Abdomen: soft, non-distended, non-tender to palpation Musculoskeletal: normal gait Extremities: no peripheral edema     ASSESSMENT AND PLAN:  1. Type 2 diabetes mellitus without complication, without long-term current use of insulin  (HCC) (Primary) - Reviewed with patient administration technique for insulin  pen. Pt able to demonstrate safe administration of insulin  today in office. Received first dose. - Advised that he should continue to take metformin  1000mg  bid. Explained to pt mechanism of action for each medication.  - Counseling provided for diet changes to help decrease glucose levels. Referral placed for diabetes educator  - POCT Hb A1C is 12.1. which is not at goal. Previosly at 6.9 on 10/23/23.  - Amb Referral to Nutrition and Diabetic Education  2. At risk for dental problems - Pt was prescribed augmentin  prior to dental procedure. Developed diarrhea and wished to be switched to different antibiotic. Was prescribed amoxicillin  by  a different provider but has not been able to get prescription. Prescription re-ordered.   - amoxicillin  (AMOXIL ) 500 MG capsule; Take 1 capsule (500 mg total) by mouth 3 (three) times daily for 10 days.  Dispense: 30 capsule; Refill: 0   Patient was given the opportunity to ask questions.  Patient verbalized understanding of the plan and was able to repeat key elements of the plan. Patient was given clear instructions to go to Emergency Department or return to medical center if symptoms don't improve, worsen, or new problems develop.The patient verbalized understanding.   Orders Placed This Encounter  Procedures   Amb Referral to Nutrition and Diabetic Education   POCT glycosylated hemoglobin (Hb A1C)     Requested Prescriptions   Signed Prescriptions Disp Refills   amoxicillin  (AMOXIL ) 500 MG capsule 30 capsule 0    Sig: Take 1 capsule (500 mg total) by mouth 3 (three) times daily for 10 days.    No follow-ups on file.  Sula Leavy Rode, PA-C

## 2024-03-03 NOTE — Patient Instructions (Signed)

## 2024-03-10 LAB — COMPREHENSIVE METABOLIC PANEL WITH GFR
ALT: 29 IU/L (ref 0–44)
AST: 19 IU/L (ref 0–40)
Albumin: 4.3 g/dL (ref 3.9–4.9)
Alkaline Phosphatase: 106 IU/L (ref 44–121)
BUN/Creatinine Ratio: 12 (ref 10–24)
BUN: 9 mg/dL (ref 8–27)
Bilirubin Total: 0.3 mg/dL (ref 0.0–1.2)
CO2: 18 mmol/L — ABNORMAL LOW (ref 20–29)
Calcium: 8.9 mg/dL (ref 8.6–10.2)
Chloride: 99 mmol/L (ref 96–106)
Creatinine, Ser: 0.77 mg/dL (ref 0.76–1.27)
Globulin, Total: 2.9 g/dL (ref 1.5–4.5)
Glucose: 259 mg/dL — ABNORMAL HIGH (ref 70–99)
Potassium: 4.2 mmol/L (ref 3.5–5.2)
Sodium: 136 mmol/L (ref 134–144)
Total Protein: 7.2 g/dL (ref 6.0–8.5)
eGFR: 100 mL/min/1.73 (ref >59)

## 2024-03-10 LAB — HEMOGLOBIN A1C
Est. average glucose Bld gHb Est-mCnc: 301 mg/dL
Hgb A1c MFr Bld: 12.1 % — ABNORMAL HIGH (ref 4.8–5.6)

## 2024-03-10 LAB — BETA-HYDROXYBUTYRIC ACID: Beta-Hydroxybutyrate: 6.7 mg/dL — ABNORMAL HIGH

## 2024-03-11 ENCOUNTER — Ambulatory Visit (HOSPITAL_COMMUNITY): Payer: Self-pay

## 2024-03-23 ENCOUNTER — Ambulatory Visit: Admitting: Family Medicine

## 2024-04-02 ENCOUNTER — Other Ambulatory Visit: Payer: Self-pay | Admitting: Family Medicine

## 2024-04-02 DIAGNOSIS — N522 Drug-induced erectile dysfunction: Secondary | ICD-10-CM

## 2024-04-20 ENCOUNTER — Other Ambulatory Visit: Payer: Self-pay | Admitting: Family Medicine

## 2024-04-20 DIAGNOSIS — G4700 Insomnia, unspecified: Secondary | ICD-10-CM

## 2024-04-20 NOTE — Telephone Encounter (Signed)
 Patient has an appt on 10/23

## 2024-04-29 ENCOUNTER — Other Ambulatory Visit: Payer: Self-pay | Admitting: Family Medicine

## 2024-04-29 DIAGNOSIS — N522 Drug-induced erectile dysfunction: Secondary | ICD-10-CM

## 2024-05-09 ENCOUNTER — Ambulatory Visit: Admitting: Dietician

## 2024-05-12 ENCOUNTER — Encounter: Payer: Self-pay | Admitting: Family Medicine

## 2024-05-12 ENCOUNTER — Ambulatory Visit: Admitting: Family Medicine

## 2024-05-12 VITALS — BP 146/85 | HR 67 | Ht 75.0 in | Wt 192.2 lb

## 2024-05-12 DIAGNOSIS — E119 Type 2 diabetes mellitus without complications: Secondary | ICD-10-CM | POA: Diagnosis not present

## 2024-05-12 DIAGNOSIS — M79601 Pain in right arm: Secondary | ICD-10-CM

## 2024-05-12 DIAGNOSIS — E782 Mixed hyperlipidemia: Secondary | ICD-10-CM | POA: Diagnosis not present

## 2024-05-12 DIAGNOSIS — M79621 Pain in right upper arm: Secondary | ICD-10-CM | POA: Diagnosis not present

## 2024-05-12 DIAGNOSIS — Z794 Long term (current) use of insulin: Secondary | ICD-10-CM

## 2024-05-12 DIAGNOSIS — R29898 Other symptoms and signs involving the musculoskeletal system: Secondary | ICD-10-CM

## 2024-05-12 DIAGNOSIS — I10 Essential (primary) hypertension: Secondary | ICD-10-CM

## 2024-05-12 MED ORDER — NAPROXEN 500 MG PO TABS: 500.0000 mg | ORAL_TABLET | Freq: Two times a day (BID) | ORAL | 2 refills | Status: AC

## 2024-05-17 NOTE — Progress Notes (Signed)
 Established Patient Office Visit  Subjective    Patient ID: Jose Deleon, male    DOB: Apr 11, 1961  Age: 63 y.o. MRN: [id number]  CC:  Chief Complaint  Patient presents with   Medical Management of Chronic Issues    HPI Jose Deleon presents for routine follow up of chronic med issues including diabetes and hypertension. Patient also reports that he is having right arm pain and intermittent numbness.. Patient denies known trauma or injury. Patient is taking no meds for sx.   Outpatient Encounter Medications as of 05/12/2024  Medication Sig   amLODipine -benazepril  (LOTREL) 10-20 MG capsule Take 1 capsule by mouth daily.   aspirin  81 MG tablet Take 1 tablet (81 mg total) by mouth daily.   Blood Glucose Monitoring Suppl (TRUERESULT BLOOD GLUCOSE) w/Device KIT 1 each by Does not apply route daily.   cholecalciferol (VITAMIN D3) 25 MCG (1000 UNIT) tablet Take 1,000 Units by mouth daily.   Cinnamon 500 MG TABS Take 500 mg by mouth daily.   Garlic 100 MG TABS Take 100 mg by mouth every morning.   glucose blood (TRUETEST TEST) test strip 1 each by Other route 2 (two) times daily. Use as instructed   insulin  glargine (LANTUS ) 100 UNIT/ML injection Inject 0.1 mLs (10 Units total) into the skin daily.   Insulin  Pen Needle (NOVOFINE) 30G X 8 MM MISC Inject 10 each into the skin as needed.   Multiple Vitamin (MULTIVITAMIN WITH MINERALS) TABS tablet Take 1 tablet by mouth daily.   naproxen (NAPROSYN) 500 MG tablet Take 1 tablet (500 mg total) by mouth 2 (two) times daily with a meal.   QUEtiapine  (SEROQUEL ) 50 MG tablet TAKE 1 TABLET BY MOUTH AT BEDTIME   sildenafil  (VIAGRA ) 100 MG tablet TAKE 1 TABLET BY MOUTH AS NEEDED FOR ERECTILE DYSFUNCTION   [DISCONTINUED] metFORMIN  (GLUCOPHAGE ) 1000 MG tablet Take 1 tablet (1,000 mg total) by mouth 2 (two) times daily with a meal.   amLODipine -benazepril  (LOTREL) 5-20 MG capsule Take 1 capsule by mouth daily. (Patient not taking: Reported on 05/12/2024)    ketoconazole  (NIZORAL ) 2 % cream Apply 1 Application topically daily. (Patient taking differently: Apply 1 Application topically every other day. Applies to toes)   ketorolac  (TORADOL ) 10 MG tablet Take 1 tablet (10 mg total) by mouth every 6 (six) hours as needed (pain).   No facility-administered encounter medications on file as of 05/12/2024.    Past Medical History:  Diagnosis Date   Callus of toe 2024   Capsulitis of foot, right 2024   FH: heart disease    GERD (gastroesophageal reflux disease)    Hav (hallux abducto valgus), right 2024   High cholesterol    History of gout    reaction from the TB RX   Hypertension 2015   TB (pulmonary tuberculosis) 1990   Type II diabetes mellitus (HCC) dx ~ 2015    Past Surgical History:  Procedure Laterality Date   CARDIAC CATHETERIZATION N/A 03/13/2016   Procedure: Left Heart Cath and Coronary Angiography;  Surgeon: Peter M Jordan, MD;  Location: [facility] INVASIVE CV LAB;  Service: Cardiovascular;  Laterality: N/A;   HEMORRHOID SURGERY  1981   lanced    Family History  Problem Relation Age of Onset   Hypertension Mother    Heart attack Mother 71   Hypertension Father 61   Multiple sclerosis Father    Stroke Maternal Grandmother     Social History   Socioeconomic History   Marital status: Single  Spouse name: Not on file   Number of children: Not on file   Years of education: Not on file   Highest education level: Associate degree: occupational, scientist, product/process development, or vocational program  Occupational History   Occupation: Retired editor, 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  Tobacco Use   Smoking status: Light Smoker    Current packs/day: 0.00    Average packs/day: 0.3 packs/day for 30.0 years (9.9 ttl pk-yrs)    Types: Cigarettes    Start date: 03/07/1986    Last attempt to quit: 03/07/2016    Years since quitting: 8.2    Passive exposure: Current   Smokeless tobacco: Never   Tobacco comments:    1-2 cigs a day  Vaping Use   Vaping status: Never Used   Substance and Sexual Activity   Alcohol use: Yes    Alcohol/week: 4.0 standard drinks of alcohol    Types: 4 Cans of beer per week   Drug use: No   Sexual activity: Not Currently  Other Topics Concern   Not on file  Social History Narrative   Not on file   Social Drivers of Health   Financial Resource Strain: Low Risk  (10/22/2023)   Overall Financial Resource Strain (CARDIA)    Difficulty of Paying Living Expenses: Not very hard  Food Insecurity: No Food Insecurity (10/22/2023)   Hunger Vital Sign    Worried About Running Out of Food in the Last Year: Never true    Ran Out of Food in the Last Year: Never true  Transportation Needs: No Transportation Needs (10/23/2023)   PRAPARE - Administrator, Civil Service (Medical): No    Lack of Transportation (Non-Medical): No  Physical Activity: Inactive (10/23/2023)   Exercise Vital Sign    Days of Exercise per Week: 0 days    Minutes of Exercise per Session: 0 min  Stress: Stress Concern Present (10/22/2023)   [employer] of Occupational Health - Occupational Stress Questionnaire    Feeling of Stress : To some extent  Social Connections: Unknown (10/23/2023)   Social Connection and Isolation Panel    Frequency of Communication with Friends and Family: More than three times a week    Frequency of Social Gatherings with Friends and Family: Once a week    Attends Religious Services: Patient declined    Database Administrator or Organizations: No    Attends Banker Meetings: Never    Marital Status: Never married  Intimate Partner Violence: Not At Risk (10/23/2023)   Humiliation, Afraid, Rape, and Kick questionnaire    Fear of Current or Ex-Partner: No    Emotionally Abused: No    Physically Abused: No    Sexually Abused: No    Review of Systems  All other systems reviewed and are negative.       Objective    BP (!) 146/85   Pulse 67   Ht 6' 3 (1.905 m)   Wt 192 lb 3.2 oz (87.2 kg)   SpO2 95%   BMI  24.02 kg/m   Physical Exam Vitals and nursing note reviewed.  Constitutional:      General: He is not in acute distress. Cardiovascular:     Rate and Rhythm: Normal rate and regular rhythm.  Pulmonary:     Effort: Pulmonary effort is normal.     Breath sounds: Normal breath sounds.  Abdominal:     Palpations: Abdomen is soft.     Tenderness: There is no abdominal tenderness.  Musculoskeletal:  Right shoulder: Normal.     Right upper arm: Normal.     Right elbow: Normal.     Right forearm: Normal.  Neurological:     General: No focal deficit present.     Mental Status: He is alert and oriented to person, place, and time.         Assessment & Plan:   Type 2 diabetes mellitus without complication, without long-term current use of insulin  (HCC)  Right arm pain  Essential hypertension  Mixed hyperlipidemia  Other orders -     Naproxen; Take 1 tablet (500 mg total) by mouth 2 (two) times daily with a meal.  Dispense: 30 tablet; Refill: 2     No follow-ups on file.   Tanda Raguel SQUIBB, MD

## 2024-05-18 ENCOUNTER — Other Ambulatory Visit: Payer: Self-pay | Admitting: Family Medicine

## 2024-05-18 DIAGNOSIS — N522 Drug-induced erectile dysfunction: Secondary | ICD-10-CM

## 2024-05-26 ENCOUNTER — Other Ambulatory Visit: Payer: Self-pay | Admitting: Family Medicine

## 2024-05-26 DIAGNOSIS — G4700 Insomnia, unspecified: Secondary | ICD-10-CM

## 2024-06-13 ENCOUNTER — Telehealth: Payer: Self-pay | Admitting: Family Medicine

## 2024-06-13 ENCOUNTER — Other Ambulatory Visit: Payer: Self-pay | Admitting: Family Medicine

## 2024-06-13 ENCOUNTER — Telehealth: Payer: Self-pay

## 2024-06-13 MED ORDER — METFORMIN HCL 1000 MG PO TABS
1000.0000 mg | ORAL_TABLET | Freq: Two times a day (BID) | ORAL | 1 refills | Status: AC
Start: 2024-06-13 — End: ?

## 2024-06-13 NOTE — Telephone Encounter (Unsigned)
 Copied from CRM [id number]. Topic: Clinical - Medication Refill >> Jun 13, 2024 11:40 AM Montie POUR wrote: Medication:  metFORMIN  (GLUCOPHAGE ) 1000 MG tablet [[id number]] DISCONTINUED This medication was discontinued on 05/12/24 but Jose Deleon has been taken this medication until 3 days ago when he ran out of medication   Has the patient contacted their pharmacy? Yes (Agent: If no, request that the patient contact the pharmacy for the refill. If patient does not wish to contact the pharmacy document the reason why and proceed with request.) (Agent: If yes, when and what did the pharmacy advise?) Pharmacy needs order to refill  This is the patient's preferred pharmacy:  [id number] - [place] ([place]), [place] - [address] [place] ([place]) [place] [address] Phone: [phone] Fax: [phone]  Is this the correct pharmacy for this prescription? Yes If no, delete pharmacy and type the correct one.   Has the prescription been filled recently? No  Is the patient out of the medication? Yes  Has the patient been seen for an appointment in the last year OR does the patient have an upcoming appointment? Yes  Can we respond through MyChart? No - Please call with any questions or why it was discontinued.   Agent: Please be advised that Rx refills may take up to 3 business days. We ask that you follow-up with your pharmacy.

## 2024-06-13 NOTE — Telephone Encounter (Signed)
 Refill sent to pharmacy 06/13/2024

## 2024-06-13 NOTE — Telephone Encounter (Signed)
 Copied from CRM [id number]. Topic: Clinical - Medication Question >> Jun 13, 2024 11:46 AM Montie POUR wrote: Reason for CRM:  Pao wants to know why his metFORMIN  (GLUCOPHAGE ) 1000 MG tablet [[id number]] was discontinued on 05/12/24. There is no note in chart that I could find why it was discontinued. He has been taken this medication until he ran out 2 days ago. His number is [phone] - please call to discuss.

## 2024-06-13 NOTE — Telephone Encounter (Signed)
 Refill has been sent.

## 2024-06-26 ENCOUNTER — Other Ambulatory Visit: Payer: Self-pay | Admitting: Family Medicine

## 2024-06-26 DIAGNOSIS — N522 Drug-induced erectile dysfunction: Secondary | ICD-10-CM

## 2024-07-26 DIAGNOSIS — N522 Drug-induced erectile dysfunction: Secondary | ICD-10-CM

## 2024-07-28 ENCOUNTER — Other Ambulatory Visit: Payer: Self-pay | Admitting: Family Medicine

## 2024-07-28 DIAGNOSIS — G4700 Insomnia, unspecified: Secondary | ICD-10-CM
